# Patient Record
Sex: Male | Born: 1985 | Race: Black or African American | Hispanic: No | Marital: Married | Smoking: Never smoker
Health system: Southern US, Community
[De-identification: ages and names within clinical notes are randomized; demographics above are authoritative.]

## PROBLEM LIST (undated history)

## (undated) DIAGNOSIS — I10 Essential (primary) hypertension: Secondary | ICD-10-CM

## (undated) DIAGNOSIS — G8929 Other chronic pain: Secondary | ICD-10-CM

## (undated) DIAGNOSIS — J302 Other seasonal allergic rhinitis: Secondary | ICD-10-CM

## (undated) DIAGNOSIS — M5430 Sciatica, unspecified side: Secondary | ICD-10-CM

## (undated) DIAGNOSIS — N289 Disorder of kidney and ureter, unspecified: Secondary | ICD-10-CM

## (undated) DIAGNOSIS — M109 Gout, unspecified: Secondary | ICD-10-CM

## (undated) DIAGNOSIS — R51 Headache: Secondary | ICD-10-CM

## (undated) DIAGNOSIS — M549 Dorsalgia, unspecified: Secondary | ICD-10-CM

## (undated) HISTORY — DX: Other chronic pain: G89.29

## (undated) HISTORY — DX: Other seasonal allergic rhinitis: J30.2

## (undated) HISTORY — DX: Essential (primary) hypertension: I10

## (undated) HISTORY — DX: Dorsalgia, unspecified: M54.9

## (undated) HISTORY — DX: Headache: R51

---

## 2003-02-11 ENCOUNTER — Emergency Department (HOSPITAL_COMMUNITY): Admission: EM | Admit: 2003-02-11 | Discharge: 2003-02-11 | Payer: Self-pay | Admitting: Emergency Medicine

## 2003-02-11 ENCOUNTER — Encounter: Payer: Self-pay | Admitting: Emergency Medicine

## 2003-09-23 DIAGNOSIS — G8929 Other chronic pain: Secondary | ICD-10-CM

## 2003-09-23 HISTORY — DX: Other chronic pain: G89.29

## 2008-08-18 ENCOUNTER — Emergency Department (HOSPITAL_COMMUNITY): Admission: EM | Admit: 2008-08-18 | Discharge: 2008-08-18 | Payer: Self-pay | Admitting: *Deleted

## 2010-04-11 ENCOUNTER — Ambulatory Visit: Payer: Self-pay | Admitting: Family Medicine

## 2010-06-27 ENCOUNTER — Ambulatory Visit: Payer: Self-pay | Admitting: Family Medicine

## 2010-07-29 ENCOUNTER — Ambulatory Visit: Payer: Self-pay | Admitting: Family Medicine

## 2010-09-24 ENCOUNTER — Ambulatory Visit
Admission: RE | Admit: 2010-09-24 | Discharge: 2010-09-24 | Payer: Self-pay | Source: Home / Self Care | Attending: Family Medicine | Admitting: Family Medicine

## 2011-06-24 LAB — POCT I-STAT, CHEM 8
Calcium, Ion: 1.18
Chloride: 104
Glucose, Bld: 103 — ABNORMAL HIGH
HCT: 50
Hemoglobin: 17
Potassium: 3.7

## 2011-08-30 ENCOUNTER — Emergency Department (HOSPITAL_COMMUNITY)
Admission: EM | Admit: 2011-08-30 | Discharge: 2011-08-31 | Disposition: A | Discharge status: Home / Self Care | Payer: 59 | Attending: Emergency Medicine | Admitting: Emergency Medicine

## 2011-08-30 ENCOUNTER — Encounter: Payer: Self-pay | Admitting: *Deleted

## 2011-08-30 DIAGNOSIS — M549 Dorsalgia, unspecified: Secondary | ICD-10-CM | POA: Insufficient documentation

## 2011-08-30 DIAGNOSIS — R10816 Epigastric abdominal tenderness: Secondary | ICD-10-CM | POA: Insufficient documentation

## 2011-08-30 DIAGNOSIS — K2970 Gastritis, unspecified, without bleeding: Secondary | ICD-10-CM

## 2011-08-30 DIAGNOSIS — K2990 Gastroduodenitis, unspecified, without bleeding: Secondary | ICD-10-CM | POA: Insufficient documentation

## 2011-08-30 DIAGNOSIS — Z79899 Other long term (current) drug therapy: Secondary | ICD-10-CM | POA: Insufficient documentation

## 2011-08-30 DIAGNOSIS — R109 Unspecified abdominal pain: Secondary | ICD-10-CM | POA: Insufficient documentation

## 2011-08-30 MED ORDER — GI COCKTAIL ~~LOC~~
ORAL | Status: AC
  Administered 2011-08-30: 30 mL via ORAL
  Filled 2011-08-30: qty 30

## 2011-08-30 MED ORDER — GI COCKTAIL ~~LOC~~
30.0000 mL | Freq: Once | ORAL | Status: AC
  Administered 2011-08-30: 30 mL via ORAL

## 2011-08-30 NOTE — ED Notes (Signed)
Sudden onset of acute abdominal pain around 4:30pm when he woke up.  Patient is denies any n/v/d

## 2011-08-30 NOTE — ED Notes (Signed)
I gave the patients visitor a cup of ice and a coke. 

## 2011-08-30 NOTE — ED Notes (Signed)
Pt resting quietly at the time. States 6/10 upper quadrant abdominal pain. No active vomiting. Family at bedside. Will continue to monitor.

## 2011-08-31 MED ORDER — PANTOPRAZOLE SODIUM 20 MG PO TBEC: 20.0000 mg | DELAYED_RELEASE_TABLET | Freq: Once | ORAL | Status: DC

## 2011-08-31 MED ORDER — PANTOPRAZOLE SODIUM 20 MG PO TBEC
20.0000 mg | DELAYED_RELEASE_TABLET | Freq: Once | ORAL | Status: AC
  Administered 2011-08-31: 20 mg via ORAL
  Filled 2011-08-31: qty 1

## 2011-08-31 NOTE — ED Provider Notes (Signed)
Medical screening examination/treatment/procedure(s) were performed by non-physician practitioner and as supervising physician I was immediately available for consultation/collaboration.   Duaine Radin L Reesa Gotschall, MD 08/31/11 0746 

## 2011-08-31 NOTE — ED Notes (Signed)
Patient given discharge paperwork; went over discharge instructions with patient.  Instructed patient to take Protonix as directed, to follow up with primary care physician, and to return to the ED for new/worsening/concerning symptoms.

## 2011-08-31 NOTE — ED Provider Notes (Signed)
History     CSN: [id number] Arrival date & time: 08/30/2011  8:27 PM   First MD Initiated Contact with Patient 08/30/11 2332      Chief Complaint  Patient presents with  . Abdominal Pain    (Consider location/radiation/quality/duration/timing/severity/associated sxs/prior treatment) HPI Comments: Patient here with acute onset of epigastric abdominal pain starting at 4pm today - he did not take any thing for this - states that has had similar in the past - reports used tums to help in the past - did not take this taday  Patient is a 25 y.o. male presenting with abdominal pain. The history is provided by the patient. No language interpreter was used.  Abdominal Pain The primary symptoms of the illness include abdominal pain. The primary symptoms of the illness do not include fever, fatigue, nausea, vomiting, diarrhea, hematemesis, hematochezia or dysuria. The current episode started 3 to 5 hours ago. The onset of the illness was sudden. The problem has not changed since onset. The illness is associated with NSAID use. The patient states that she believes she is currently not pregnant. The patient has not had a change in bowel habit. Additional symptoms associated with the illness include back pain. Symptoms associated with the illness do not include chills, anorexia, diaphoresis, heartburn, constipation, urgency or hematuria. Significant associated medical issues include GERD.    History reviewed. No pertinent past medical history.  History reviewed. No pertinent past surgical history.  History reviewed. No pertinent family history.  History  Substance Use Topics  . Smoking status: Never Smoker   . Smokeless tobacco: Not on file  . Alcohol Use: No      Review of Systems  Constitutional: Negative for fever, chills, diaphoresis and fatigue.  Gastrointestinal: Positive for abdominal pain. Negative for heartburn, nausea, vomiting, diarrhea, constipation, hematochezia, anorexia and  hematemesis.  Genitourinary: Negative for dysuria, urgency and hematuria.  Musculoskeletal: Positive for back pain.  All other systems reviewed and are negative.    Allergies  Review of patient's allergies indicates no known allergies.  Home Medications   Current Outpatient Rx  Name Route Sig Dispense Refill  . LISINOPRIL-HYDROCHLOROTHIAZIDE 10-12.5 MG PO TABS Oral Take 1 tablet by mouth daily.        BP 114/76  Pulse 64  Temp(Src) 97.8 F (36.6 C) (Oral)  Resp 18  Ht 6\' 3"  (1.905 m)  Wt 235 lb (106.595 kg)  BMI 29.37 kg/m2  SpO2 98%  Physical Exam  Nursing note and vitals reviewed. Constitutional: He is oriented to person, place, and time. He appears well-developed and well-nourished. No distress.  HENT:  Head: Normocephalic and atraumatic.  Right Ear: External ear normal.  Left Ear: External ear normal.  Mouth/Throat: No oropharyngeal exudate.  Eyes: Conjunctivae are normal. Pupils are equal, round, and reactive to light. No scleral icterus.  Neck: Normal range of motion. Neck supple.  Cardiovascular: Normal rate, regular rhythm and normal heart sounds.   Pulmonary/Chest: Effort normal and breath sounds normal. No respiratory distress.  Abdominal: Soft. Bowel sounds are normal. He exhibits no distension. There is tenderness. There is no rebound and no guarding.       Epigastric ttp - negative Murphy's sign  Musculoskeletal: Normal range of motion.  Lymphadenopathy:    He has no cervical adenopathy.  Neurological: He is alert and oriented to person, place, and time.  Skin: Skin is warm and dry.  Psychiatric: He has a normal mood and affect. His behavior is normal. Judgment and thought content normal.  ED Course  Procedures (including critical care time)  Labs Reviewed - No data to display No results found.   Gastritis   MDM  Improvement in symptoms after protonix and GI cocktail - no ttp to RUQ so doubt cholelithiasis or cholecystitis.           Izola Price [place], [place] 08/31/11 [phone]

## 2011-08-31 NOTE — ED Notes (Signed)
I gave the patients visitor a warm pack for her back.

## 2011-12-01 ENCOUNTER — Other Ambulatory Visit: Payer: Self-pay | Admitting: Family Medicine

## 2012-12-31 ENCOUNTER — Other Ambulatory Visit: Payer: Self-pay | Admitting: Family Medicine

## 2013-01-01 ENCOUNTER — Other Ambulatory Visit: Payer: Self-pay | Admitting: Family Medicine

## 2013-01-10 ENCOUNTER — Other Ambulatory Visit: Payer: Self-pay | Admitting: Family Medicine

## 2013-01-10 ENCOUNTER — Encounter: Payer: Self-pay | Admitting: Family Medicine

## 2013-01-10 ENCOUNTER — Ambulatory Visit (INDEPENDENT_AMBULATORY_CARE_PROVIDER_SITE_OTHER): Payer: PRIVATE HEALTH INSURANCE | Admitting: Family Medicine

## 2013-01-10 VITALS — BP 140/90 | HR 88 | Wt 242.0 lb

## 2013-01-10 DIAGNOSIS — I10 Essential (primary) hypertension: Secondary | ICD-10-CM

## 2013-01-10 DIAGNOSIS — Z79899 Other long term (current) drug therapy: Secondary | ICD-10-CM

## 2013-01-10 LAB — CBC WITH DIFFERENTIAL/PLATELET
Basophils Relative: 1 % (ref 0–1)
Eosinophils Absolute: 0.2 10*3/uL (ref 0.0–0.7)
Eosinophils Relative: 3 % (ref 0–5)
HCT: 39.6 % (ref 39.0–52.0)
Hemoglobin: 13.7 g/dL (ref 13.0–17.0)
Lymphs Abs: 2.6 10*3/uL (ref 0.7–4.0)
MCH: 27.5 pg (ref 26.0–34.0)
MCHC: 34.6 g/dL (ref 30.0–36.0)
MCV: 79.4 fL (ref 78.0–100.0)
Monocytes Absolute: 0.4 10*3/uL (ref 0.1–1.0)
Monocytes Relative: 6 % (ref 3–12)
Neutrophils Relative %: 49 % (ref 43–77)

## 2013-01-10 LAB — COMPREHENSIVE METABOLIC PANEL
Alkaline Phosphatase: 71 U/L (ref 39–117)
BUN: 13 mg/dL (ref 6–23)
CO2: 26 mEq/L (ref 19–32)
Glucose, Bld: 110 mg/dL — ABNORMAL HIGH (ref 70–99)
Total Bilirubin: 0.7 mg/dL (ref 0.3–1.2)

## 2013-01-10 LAB — LIPID PANEL
Cholesterol: 186 mg/dL (ref 0–200)
Triglycerides: 259 mg/dL — ABNORMAL HIGH (ref <150)
VLDL: 52 mg/dL — ABNORMAL HIGH (ref 0–40)

## 2013-01-10 MED ORDER — LISINOPRIL-HYDROCHLOROTHIAZIDE 10-12.5 MG PO TABS: ORAL_TABLET | ORAL | Status: DC

## 2013-01-10 NOTE — Progress Notes (Signed)
  Subjective:    Patient ID: Harold Young, male    DOB: 1986-06-08, 27 y.o.   MRN: [id number]  HPI He is here for recheck on his blood pressure. He has been out of medicine for several weeks. He was not seen last year due to financial difficulties. He now apparently has better insurance coverage. His work keeps him quite physically active. He and his wife have made some dietary changes recently which he hopes will help with his blood pressure is well. They have both cut back on carbonated beverages.He has not had a complete exam done quite some time.   Review of Systems     Objective:   Physical Exam Alert and in no distress. Blood pressure is recorded.       Assessment & Plan:  Hypertension - Plan: Lipid panel, CBC with Differential, Comprehensive metabolic panel, lisinopril-hydrochlorothiazide (PRINZIDE,ZESTORETIC) 10-12.5 MG per tablet  Encounter for long-term (current) use of other medications - Plan: Lipid panel, CBC with Differential, Comprehensive metabolic panel I will do his medication. Discussed physical activity as well as dietary modification. Since he is busy with work, I  Recommended that he concentrate more on his eating habits especially cutting back on carbohydrates. He is to schedule complete exam to

## 2013-01-10 NOTE — Patient Instructions (Signed)
Look at potentially cutting back on carbohydrates. WHITE FOOD

## 2013-01-11 LAB — HEMOGLOBIN A1C
Hgb A1c MFr Bld: 6.1 % — ABNORMAL HIGH (ref <5.7)
Mean Plasma Glucose: 128 mg/dL — ABNORMAL HIGH (ref <117)

## 2013-01-11 NOTE — Progress Notes (Signed)
Quick Note:  CALLED PT TO INFORMED WORD FOR WORD Let him know that his triglycerides are high. Let him know also that he is at risk for diabetes. Send dietary information. ALSO MAILED COPY OF LABS AND DIET INFO FOR CHOLESTEROL AND DIABETES  ______

## 2013-02-23 ENCOUNTER — Encounter: Payer: Self-pay | Admitting: Internal Medicine

## 2013-03-08 ENCOUNTER — Encounter: Payer: Self-pay | Admitting: Medical

## 2013-03-08 ENCOUNTER — Ambulatory Visit (INDEPENDENT_AMBULATORY_CARE_PROVIDER_SITE_OTHER): Payer: Self-pay | Admitting: Medical

## 2013-03-08 VITALS — BP 112/82 | HR 76 | Temp 98.0°F | Resp 16 | Ht 73.2 in | Wt 231.0 lb

## 2013-03-08 DIAGNOSIS — R7989 Other specified abnormal findings of blood chemistry: Secondary | ICD-10-CM

## 2013-03-08 DIAGNOSIS — R7301 Impaired fasting glucose: Secondary | ICD-10-CM

## 2013-03-08 DIAGNOSIS — I10 Essential (primary) hypertension: Secondary | ICD-10-CM

## 2013-03-08 DIAGNOSIS — E781 Pure hyperglyceridemia: Secondary | ICD-10-CM

## 2013-03-08 DIAGNOSIS — Z0000 Encounter for general adult medical examination without abnormal findings: Secondary | ICD-10-CM

## 2013-03-08 LAB — BASIC METABOLIC PANEL
CO2: 24 mEq/L (ref 19–32)
Calcium: 10 mg/dL (ref 8.4–10.5)
Chloride: 102 mEq/L (ref 96–112)
Glucose, Bld: 93 mg/dL (ref 70–99)
Sodium: 139 mEq/L (ref 135–145)

## 2013-03-08 LAB — LIPID PANEL
HDL: 36 mg/dL — ABNORMAL LOW (ref >39)
LDL Cholesterol: 133 mg/dL — ABNORMAL HIGH (ref 0–99)
Triglycerides: 124 mg/dL (ref <150)

## 2013-03-08 LAB — POCT URINALYSIS DIPSTICK
Bilirubin, UA: NEGATIVE
Blood, UA: NEGATIVE
Glucose, UA: NEGATIVE
Leukocytes, UA: NEGATIVE
Nitrite, UA: NEGATIVE
Urobilinogen, UA: NEGATIVE
pH, UA: 5

## 2013-03-08 NOTE — Progress Notes (Signed)
Subjective:   HPI  Harold Young is a 27 y.o. male who presents for a complete physical.  Saw Dr. Susann Givens recently for recheck on hypertension.  He is also here today to discuss recent abnormal blood sugar and cholesterol labs.      Preventative care: Last ophthalmology visit:N/A Last dental visit:N/A Last colonoscopy:N/A Last prostate exam: N/A Last EKG:2006- [employer] Last labs:12/2012  Prior vaccinations: TD or Tdap:2006 -[employer] Influenza:N/A Pneumococcal:N/A Shingles/ZostavaxN/A:  Advanced directive:N/A Health care power of attorney:N/A Living will:N/A  Concerns: compliant with BP medication.  Diagnosed with hypertension 2-3 years ago.  Since last visit started making significant diet changes, eating baked foods instead of fried, wheat bread instead of white, drinking more water, has lost 11 lb since last visit.    Reviewed their medical, surgical, family, social, medication, and allergy history and updated chart as appropriate.   Past Medical History  Diagnosis Date  . Hypertension   . Seasonal allergic rhinitis   . Chronic headaches   . Chronic back pain 2005    s/p injury at work    History reviewed. No pertinent past surgical history.  Family History  Problem Relation Age of Onset  . Hypertension Mother   . Polycystic ovary syndrome Mother   . Diabetes Father   . Hypertension Father   . Bipolar disorder Sister   . Cancer Neg Hx   . Heart disease Neg Hx   . Stroke Neg Hx     History   Social History  . Marital Status: Legally Separated    Spouse Name: N/A    Number of Children: N/A  . Years of Education: N/A   Occupational History  . Not on file.   Social History Main Topics  . Smoking status: Never Smoker   . Smokeless tobacco: Not on file  . Alcohol Use: No  . Drug Use: No  . Sexually Active: Not on file   Other Topics Concern  . Not on file   Social History Narrative   Exercise 3 days a week with walking, some caffeine intake,  works Occupational psychologist at [employer], has 2 children, has significant other    Current Outpatient Prescriptions on File Prior to Visit  Medication Sig Dispense Refill  . lisinopril-hydrochlorothiazide (PRINZIDE,ZESTORETIC) 10-12.5 MG per tablet TAKE ONE TABLET BY MOUTH EVERY DAY  90 tablet  3   No current facility-administered medications on file prior to visit.    No Known Allergies   Review of Systems Constitutional: -fever, -chills, -sweats, -unexpected weight change, -decreased appetite, -fatigue Allergy: -sneezing, -itching, -congestion Dermatology: -changing moles, --rash, -lumps ENT: -runny nose, -ear pain, -sore throat, -hoarseness, +sinus pain, -teeth pain, - ringing in ears, -hearing loss, -nosebleeds Cardiology: -chest pain, -palpitations, -swelling, -difficulty breathing when lying flat, -waking up short of breath Respiratory: -cough, -shortness of breath, -difficulty breathing with exercise or exertion, -wheezing, -coughing up blood Gastroenterology:+-abdominal pain, -nausea, -vomiting, -diarrhea, -constipation, -blood in stool, -changes in bowel movement, -difficulty swallowing or eating Hematology: -bleeding, -bruising  Musculoskeletal: -joint aches, -muscle aches, -joint swelling, -+Back pain, -neck pain, -cramping, -changes in gait Ophthalmology: denies vision changes, eye redness, itching, discharge Urology: -burning with urination, -difficulty urinating, -blood in urine, -urinary frequency, -urgency, -incontinence Neurology: -+headache, -weakness, -tingling, -numbness, -memory loss, -falls, -dizziness Psychology: -depressed mood, -agitation, -sleep problems     Objective:   Physical Exam  Nurse notes and vital signs reviewed  General appearance: alert, no distress, WD/WN, AA male Skin:tattoos bilat upper arms, no worrisome lesions HEENT: normocephalic,  conjunctiva/corneas normal, sclerae anicteric, PERRLA, EOMi, nares patent, no discharge or erythema, pharynx  normal Oral cavity: MMM, tongue normal, teeth in good repair Neck: supple, no lymphadenopathy, no thyromegaly, no masses, normal ROM, no bruits Chest: non tender, normal shape and expansion Heart: RRR, normal S1, S2, no murmurs Lungs: CTA bilaterally, no wheezes, rhonchi, or rales Abdomen: +bs, soft, surgical scar inferior to umbilicus, non tender, non distended, no masses, no hepatomegaly, no splenomegaly, no bruits Back: non tender, normal ROM, no scoliosis Musculoskeletal: upper extremities non tender, no obvious deformity, normal ROM throughout, lower extremities non tender, no obvious deformity, normal ROM throughout Extremities: no edema, no cyanosis, no clubbing Pulses: 2+ symmetric, upper and lower extremities, normal cap refill Neurological: alert, oriented x 3, CN2-12 intact, strength normal upper extremities and lower extremities, sensation normal throughout, DTRs 2+ throughout, no cerebellar signs, gait normal Psychiatric: normal affect, behavior normal, pleasant  GU: normal male external genitalia, circumcised, nontender, no masses, no hernia, no lymphadenopathy Rectal: deferred   Assessment and Plan :      Encounter Diagnoses  Name Primary?  . Routine general medical examination at a health care facility Yes  . Other abnormal blood chemistry   . Essential hypertension, benign   . Impaired fasting blood sugar   . Hypertriglyceridemia     Physical exam - discussed healthy lifestyle, diet, exercise, preventative care, vaccinations, and addressed their concerns.  See dentist soon for routine care.  Recent elevated creatinine.   If still elevated, consider medication change, consider abdomen imaging for renal artery stenosis.    HTN - controlled on current medication.  Impaired fasting glucose/hypertriglyceridemia - glad to hear he has made diet changes and exercising more.  C/t present diet and exercise.  Follow-up pending labs today.

## 2013-03-08 NOTE — Progress Notes (Signed)
RIGHT ARM BP-120/90 LEFT ARM BP-130/90

## 2013-03-09 ENCOUNTER — Other Ambulatory Visit: Payer: Self-pay | Admitting: Medical

## 2013-03-09 DIAGNOSIS — R809 Proteinuria, unspecified: Secondary | ICD-10-CM

## 2013-03-09 DIAGNOSIS — I10 Essential (primary) hypertension: Secondary | ICD-10-CM

## 2013-03-09 DIAGNOSIS — R7989 Other specified abnormal findings of blood chemistry: Secondary | ICD-10-CM

## 2013-03-09 MED ORDER — HYDROCHLOROTHIAZIDE 25 MG PO TABS: 25.0000 mg | ORAL_TABLET | Freq: Every day | ORAL | Status: DC

## 2013-03-17 ENCOUNTER — Ambulatory Visit
Admission: RE | Admit: 2013-03-17 | Discharge: 2013-03-17 | Disposition: A | Discharge status: Home / Self Care | Payer: PRIVATE HEALTH INSURANCE | Source: Ambulatory Visit | Attending: Medical | Admitting: Medical

## 2013-03-17 ENCOUNTER — Ambulatory Visit
Admission: RE | Admit: 2013-03-17 | Discharge: 2013-03-17 | Disposition: A | Discharge status: Home / Self Care | Payer: 59 | Source: Ambulatory Visit | Attending: Medical | Admitting: Medical

## 2013-03-17 DIAGNOSIS — I10 Essential (primary) hypertension: Secondary | ICD-10-CM

## 2013-03-17 DIAGNOSIS — R809 Proteinuria, unspecified: Secondary | ICD-10-CM

## 2013-03-17 DIAGNOSIS — R7989 Other specified abnormal findings of blood chemistry: Secondary | ICD-10-CM

## 2013-03-21 ENCOUNTER — Telehealth: Payer: Self-pay | Admitting: Medical

## 2013-03-21 NOTE — Telephone Encounter (Signed)
Pt called and requested a copy of his labs be sent to him. Done

## 2013-06-08 ENCOUNTER — Telehealth: Payer: Self-pay | Admitting: Medical

## 2013-06-08 MED ORDER — HYDROCHLOROTHIAZIDE 25 MG PO TABS: 25.0000 mg | ORAL_TABLET | Freq: Every day | ORAL | Status: DC

## 2013-06-08 NOTE — Telephone Encounter (Signed)
Done

## 2013-06-08 NOTE — Telephone Encounter (Signed)
Pt needs a refill on bp meds sent to [facility]/ pt is completely out.

## 2013-06-09 ENCOUNTER — Encounter: Payer: Self-pay | Admitting: Medical

## 2013-12-04 ENCOUNTER — Emergency Department (HOSPITAL_COMMUNITY)
Admission: EM | Admit: 2013-12-04 | Discharge: 2013-12-04 | Disposition: A | Discharge status: Home / Self Care | Payer: No Typology Code available for payment source | Attending: Emergency Medicine | Admitting: Emergency Medicine

## 2013-12-04 ENCOUNTER — Encounter (HOSPITAL_COMMUNITY): Payer: Self-pay | Admitting: Emergency Medicine

## 2013-12-04 DIAGNOSIS — I10 Essential (primary) hypertension: Secondary | ICD-10-CM | POA: Insufficient documentation

## 2013-12-04 DIAGNOSIS — R51 Headache: Secondary | ICD-10-CM

## 2013-12-04 DIAGNOSIS — B9789 Other viral agents as the cause of diseases classified elsewhere: Secondary | ICD-10-CM | POA: Insufficient documentation

## 2013-12-04 DIAGNOSIS — Z79899 Other long term (current) drug therapy: Secondary | ICD-10-CM | POA: Insufficient documentation

## 2013-12-04 DIAGNOSIS — Z87828 Personal history of other (healed) physical injury and trauma: Secondary | ICD-10-CM | POA: Insufficient documentation

## 2013-12-04 DIAGNOSIS — B349 Viral infection, unspecified: Secondary | ICD-10-CM

## 2013-12-04 DIAGNOSIS — G8929 Other chronic pain: Secondary | ICD-10-CM | POA: Insufficient documentation

## 2013-12-04 DIAGNOSIS — R519 Headache, unspecified: Secondary | ICD-10-CM

## 2013-12-04 MED ORDER — METOCLOPRAMIDE HCL 5 MG/ML IJ SOLN
10.0000 mg | Freq: Once | INTRAMUSCULAR | Status: AC
  Administered 2013-12-04: 10 mg via INTRAVENOUS
  Filled 2013-12-04: qty 2

## 2013-12-04 MED ORDER — IBUPROFEN 600 MG PO TABS: 600.0000 mg | ORAL_TABLET | Freq: Four times a day (QID) | ORAL | Status: DC | PRN

## 2013-12-04 MED ORDER — DIPHENHYDRAMINE HCL 50 MG/ML IJ SOLN
25.0000 mg | Freq: Once | INTRAMUSCULAR | Status: AC
  Administered 2013-12-04: 25 mg via INTRAVENOUS
  Filled 2013-12-04: qty 1

## 2013-12-04 MED ORDER — SODIUM CHLORIDE 0.9 % IV BOLUS (SEPSIS)
1000.0000 mL | Freq: Once | INTRAVENOUS | Status: AC
  Administered 2013-12-04: 1000 mL via INTRAVENOUS

## 2013-12-04 MED ORDER — KETOROLAC TROMETHAMINE 30 MG/ML IJ SOLN
30.0000 mg | Freq: Once | INTRAMUSCULAR | Status: AC
  Administered 2013-12-04: 30 mg via INTRAVENOUS
  Filled 2013-12-04: qty 1

## 2013-12-04 NOTE — ED Provider Notes (Signed)
CSN: [id number]     Arrival date & time 12/04/13  0046 History   First MD Initiated Contact with Patient 12/04/13 0325     Chief Complaint  Patient presents with  . Headache  . Generalized Body Aches  . Chills     (Consider location/radiation/quality/duration/timing/severity/associated sxs/prior Treatment) HPI Comments: Along with the headaches, patient has diffuse body aches, and URI like sx.  Patient is a 28 y.o. male presenting with headaches. The history is provided by the patient.  Headache Pain location:  Frontal Quality:  Stabbing Radiates to:  Does not radiate Severity currently:  7/10 Severity at highest:  7/10 Onset quality:  Gradual Duration:  1 day Timing:  Constant Progression:  Waxing and waning Chronicity:  New Similar to prior headaches: no   Relieved by:  None tried Worsened by:  Light Associated symptoms: congestion and myalgias     Past Medical History  Diagnosis Date  . Hypertension   . Seasonal allergic rhinitis   . Chronic headaches   . Chronic back pain 2005    s/p injury at work   History reviewed. No pertinent past surgical history. Family History  Problem Relation Age of Onset  . Hypertension Mother   . Polycystic ovary syndrome Mother   . Diabetes Father   . Hypertension Father   . Bipolar disorder Sister   . Cancer Neg Hx   . Heart disease Neg Hx   . Stroke Neg Hx    History  Substance Use Topics  . Smoking status: Never Smoker   . Smokeless tobacco: Not on file  . Alcohol Use: No    Review of Systems  HENT: Positive for congestion.   Musculoskeletal: Positive for myalgias.  Skin: Negative for rash.  Neurological: Positive for headaches.  All other systems reviewed and are negative.      Allergies  Review of patient's allergies indicates no known allergies.  Home Medications   Current Outpatient Rx  Name  Route  Sig  Dispense  Refill  . Multiple Vitamin (MULTIVITAMIN WITH MINERALS) TABS tablet   Oral   Take 1  tablet by mouth daily.         Marland Kitchen. VALSARTAN-HYDROCHLOROTHIAZIDE PO   Oral   Take 1 tablet by mouth daily.         Marland Kitchen. ibuprofen (ADVIL,MOTRIN) 600 MG tablet   Oral   Take 1 tablet (600 mg total) by mouth every 6 (six) hours as needed.   30 tablet   0    BP 120/70  Pulse 81  Temp(Src) 99.8 F (37.7 C) (Oral)  Resp 16  Ht 6\' 2"  (1.88 m)  Wt 241 lb (109.317 kg)  BMI 30.93 kg/m2  SpO2 100% Physical Exam  Nursing note and vitals reviewed. Constitutional: He is oriented to person, place, and time. He appears well-developed.  HENT:  Head: Normocephalic and atraumatic.  Eyes: Conjunctivae and EOM are normal. Pupils are equal, round, and reactive to light.  Neck: Normal range of motion. Neck supple.  Cardiovascular: Normal rate and regular rhythm.   Pulmonary/Chest: Effort normal and breath sounds normal.  Abdominal: Soft. Bowel sounds are normal. He exhibits no distension. There is no tenderness. There is no rebound and no guarding.  Neurological: He is alert and oriented to person, place, and time. No cranial nerve deficit. Coordination normal.  Skin: Skin is warm.    ED Course  Procedures (including critical care time) Labs Review Labs Reviewed - No data to display Imaging Review No results  found.   EKG Interpretation None      MDM   Final diagnoses:  Headache  Viral syndrome    Pt comes in with uri like sx and headaches.  Headaches are moderate, not as bad as his migraines - and responded to iv hydration and meds. Pt also has flu like sx with the body aches. Vitals are stable and he is a healthy male.  Safe for discharge.  Derwood Kaplan, MD 12/04/13 [phone]

## 2013-12-04 NOTE — Discharge Instructions (Signed)
We saw you in the ER for headaches. All the labs and imaging are normal. We are not sure what is causing your headaches, however, there appears to be no evidence of infection, bleeds or tumors based on our exam and results.  Please take motrin round the clock for the next 6 hours, and take other meds prescribed only for break through pain. See your doctor if the pain persists, as you might need better medications or a specialist.   Viral Infections A viral infection can be caused by different types of viruses.Most viral infections are not serious and resolve on their own. However, some infections may cause severe symptoms and may lead to further complications. SYMPTOMS Viruses can frequently cause:  Minor sore throat.  Aches and pains.  Headaches.  Runny nose.  Different types of rashes.  Watery eyes.  Tiredness.  Cough.  Loss of appetite.  Gastrointestinal infections, resulting in nausea, vomiting, and diarrhea. These symptoms do not respond to antibiotics because the infection is not caused by bacteria. However, you might catch a bacterial infection following the viral infection. This is sometimes called a "superinfection." Symptoms of such a bacterial infection may include:  Worsening sore throat with pus and difficulty swallowing.  Swollen neck glands.  Chills and a high or persistent fever.  Severe headache.  Tenderness over the sinuses.  Persistent overall ill feeling (malaise), muscle aches, and tiredness (fatigue).  Persistent cough.  Yellow, green, or brown mucus production with coughing. HOME CARE INSTRUCTIONS   Only take over-the-counter or prescription medicines for pain, discomfort, diarrhea, or fever as directed by your caregiver.  Drink enough water and fluids to keep your urine clear or pale yellow. Sports drinks can provide valuable electrolytes, sugars, and hydration.  Get plenty of rest and maintain proper nutrition. Soups and broths with  crackers or rice are fine. SEEK IMMEDIATE MEDICAL CARE IF:   You have severe headaches, shortness of breath, chest pain, neck pain, or an unusual rash.  You have uncontrolled vomiting, diarrhea, or you are unable to keep down fluids.  You or your child has an oral temperature above 102 F (38.9 C), not controlled by medicine.  Your baby is older than 3 months with a rectal temperature of 102 F (38.9 C) or higher.  Your baby is 533 months old or younger with a rectal temperature of 100.4 F (38 C) or higher. MAKE SURE YOU:   Understand these instructions.  Will watch your condition.  Will get help right away if you are not doing well or get worse. Document Released: 06/18/2005 Document Revised: 12/01/2011 Document Reviewed: 01/13/2011 [facility] Patient Information 2014 [place], [place].  Headaches, Frequently Asked Questions MIGRAINE HEADACHES Q: What is migraine? What causes it? How can I treat it? A: Generally, migraine headaches begin as a dull ache. Then they develop into a constant, throbbing, and pulsating pain. You may experience pain at the temples. You may experience pain at the front or back of one or both sides of the head. The pain is usually accompanied by a combination of:  Nausea.  Vomiting.  Sensitivity to light and noise. Some people (about 15%) experience an aura (see below) before an attack. The cause of migraine is believed to be chemical reactions in the brain. Treatment for migraine may include over-the-counter or prescription medications. It may also include self-help techniques. These include relaxation training and biofeedback.  Q: What is an aura? A: About 15% of people with migraine get an "aura". This is a sign  of neurological symptoms that occur before a migraine headache. You may see wavy or jagged lines, dots, or flashing lights. You might experience tunnel vision or blind spots in one or both eyes. The aura can include visual or auditory hallucinations  (something imagined). It may include disruptions in smell (such as strange odors), taste or touch. Other symptoms include:  Numbness.  A "pins and needles" sensation.  Difficulty in recalling or speaking the correct word. These neurological events may last as long as 60 minutes. These symptoms will fade as the headache begins. Q: What is a trigger? A: Certain physical or environmental factors can lead to or "trigger" a migraine. These include:  Foods.  Hormonal changes.  Weather.  Stress. It is important to remember that triggers are different for everyone. To help prevent migraine attacks, you need to figure out which triggers affect you. Keep a headache diary. This is a good way to track triggers. The diary will help you talk to your healthcare professional about your condition. Q: Does weather affect migraines? A: Bright sunshine, hot, humid conditions, and drastic changes in barometric pressure may lead to, or "trigger," a migraine attack in some people. But studies have shown that weather does not act as a trigger for everyone with migraines. Q: What is the link between migraine and hormones? A: Hormones start and regulate many of your body's functions. Hormones keep your body in balance within a constantly changing environment. The levels of hormones in your body are unbalanced at times. Examples are during menstruation, pregnancy, or menopause. That can lead to a migraine attack. In fact, about three quarters of all women with migraine report that their attacks are related to the menstrual cycle.  Q: Is there an increased risk of stroke for migraine sufferers? A: The likelihood of a migraine attack causing a stroke is very remote. That is not to say that migraine sufferers cannot have a stroke associated with their migraines. In persons under age 28, the most common associated factor for stroke is migraine headache. But over the course of a person's normal life span, the occurrence of  migraine headache may actually be associated with a reduced risk of dying from cerebrovascular disease due to stroke.  Q: What are acute medications for migraine? A: Acute medications are used to treat the pain of the headache after it has started. Examples over-the-counter medications, NSAIDs, ergots, and triptans.  Q: What are the triptans? A: Triptans are the newest class of abortive medications. They are specifically targeted to treat migraine. Triptans are vasoconstrictors. They moderate some chemical reactions in the brain. The triptans work on receptors in your brain. Triptans help to restore the balance of a neurotransmitter called serotonin. Fluctuations in levels of serotonin are thought to be a main cause of migraine.  Q: Are over-the-counter medications for migraine effective? A: Over-the-counter, or "OTC," medications may be effective in relieving mild to moderate pain and associated symptoms of migraine. But you should see your caregiver before beginning any treatment regimen for migraine.  Q: What are preventive medications for migraine? A: Preventive medications for migraine are sometimes referred to as "prophylactic" treatments. They are used to reduce the frequency, severity, and length of migraine attacks. Examples of preventive medications include antiepileptic medications, antidepressants, beta-blockers, calcium channel blockers, and NSAIDs (nonsteroidal anti-inflammatory drugs). Q: Why are anticonvulsants used to treat migraine? A: During the past few years, there has been an increased interest in antiepileptic drugs for the prevention of migraine. They are sometimes referred  to as "anticonvulsants". Both epilepsy and migraine may be caused by similar reactions in the brain.  Q: Why are antidepressants used to treat migraine? A: Antidepressants are typically used to treat people with depression. They may reduce migraine frequency by regulating chemical levels, such as serotonin, in  the brain.  Q: What alternative therapies are used to treat migraine? A: The term "alternative therapies" is often used to describe treatments considered outside the scope of conventional Western medicine. Examples of alternative therapy include acupuncture, acupressure, and yoga. Another common alternative treatment is herbal therapy. Some herbs are believed to relieve headache pain. Always discuss alternative therapies with your caregiver before proceeding. Some herbal products contain arsenic and other toxins. TENSION HEADACHES Q: What is a tension-type headache? What causes it? How can I treat it? A: Tension-type headaches occur randomly. They are often the result of temporary stress, anxiety, fatigue, or anger. Symptoms include soreness in your temples, a tightening band-like sensation around your head (a "vice-like" ache). Symptoms can also include a pulling feeling, pressure sensations, and contracting head and neck muscles. The headache begins in your forehead, temples, or the back of your head and neck. Treatment for tension-type headache may include over-the-counter or prescription medications. Treatment may also include self-help techniques such as relaxation training and biofeedback. CLUSTER HEADACHES Q: What is a cluster headache? What causes it? How can I treat it? A: Cluster headache gets its name because the attacks come in groups. The pain arrives with little, if any, warning. It is usually on one side of the head. A tearing or bloodshot eye and a runny nose on the same side of the headache may also accompany the pain. Cluster headaches are believed to be caused by chemical reactions in the brain. They have been described as the most severe and intense of any headache type. Treatment for cluster headache includes prescription medication and oxygen. SINUS HEADACHES Q: What is a sinus headache? What causes it? How can I treat it? A: When a cavity in the bones of the face and skull (a sinus)  becomes inflamed, the inflammation will cause localized pain. This condition is usually the result of an allergic reaction, a tumor, or an infection. If your headache is caused by a sinus blockage, such as an infection, you will probably have a fever. An x-ray will confirm a sinus blockage. Your caregiver's treatment might include antibiotics for the infection, as well as antihistamines or decongestants.  REBOUND HEADACHES Q: What is a rebound headache? What causes it? How can I treat it? A: A pattern of taking acute headache medications too often can lead to a condition known as "rebound headache." A pattern of taking too much headache medication includes taking it more than 2 days per week or in excessive amounts. That means more than the label or a caregiver advises. With rebound headaches, your medications not only stop relieving pain, they actually begin to cause headaches. Doctors treat rebound headache by tapering the medication that is being overused. Sometimes your caregiver will gradually substitute a different type of treatment or medication. Stopping may be a challenge. Regularly overusing a medication increases the potential for serious side effects. Consult a caregiver if you regularly use headache medications more than 2 days per week or more than the label advises. ADDITIONAL QUESTIONS AND ANSWERS Q: What is biofeedback? A: Biofeedback is a self-help treatment. Biofeedback uses special equipment to monitor your body's involuntary physical responses. Biofeedback monitors:  Breathing.  Pulse.  Heart rate.  Temperature.  Muscle tension.  Brain activity. Biofeedback helps you refine and perfect your relaxation exercises. You learn to control the physical responses that are related to stress. Once the technique has been mastered, you do not need the equipment any more. Q: Are headaches hereditary? A: Four out of five (80%) of people that suffer report a family history of migraine.  Scientists are not sure if this is genetic or a family predisposition. Despite the uncertainty, a child has a 50% chance of having migraine if one parent suffers. The child has a 75% chance if both parents suffer.  Q: Can children get headaches? A: By the time they reach high school, most young people have experienced some type of headache. Many safe and effective approaches or medications can prevent a headache from occurring or stop it after it has begun.  Q: What type of doctor should I see to diagnose and treat my headache? A: Start with your primary caregiver. Discuss his or her experience and approach to headaches. Discuss methods of classification, diagnosis, and treatment. Your caregiver may decide to recommend you to a headache specialist, depending upon your symptoms or other physical conditions. Having diabetes, allergies, etc., may require a more comprehensive and inclusive approach to your headache. The National Headache Foundation will provide, upon request, a list of [facility] physician members in your state. Document Released: 11/29/2003 Document Revised: 12/01/2011 Document Reviewed: 05/08/2008 [facility] Patient Information 2014 [place], [place].

## 2013-12-04 NOTE — ED Notes (Signed)
The pt has been ill all day  With a headache chills feeling hot with body aches and dizziness.  He has other family members here with the same

## 2013-12-04 NOTE — ED Notes (Signed)
The pt is still waiting to be seen by the doctor.  His headache is no beter

## 2013-12-04 NOTE — ED Notes (Signed)
Pt discharged home with all belongings, alert and oriented x4, 1 new RX prescribed, pt verbalizes understanding of discharge instructions, pt driven home by spouse

## 2013-12-04 NOTE — ED Notes (Signed)
Pt. reports headache , chills and body aches / dizziness onset today .

## 2013-12-09 ENCOUNTER — Ambulatory Visit (INDEPENDENT_AMBULATORY_CARE_PROVIDER_SITE_OTHER): Payer: Self-pay | Admitting: Medical

## 2013-12-09 ENCOUNTER — Encounter: Payer: Self-pay | Admitting: Medical

## 2013-12-09 VITALS — BP 100/80 | HR 80 | Temp 97.9°F | Resp 16 | Wt 236.0 lb

## 2013-12-09 DIAGNOSIS — N183 Chronic kidney disease, stage 3 unspecified: Secondary | ICD-10-CM

## 2013-12-09 DIAGNOSIS — E860 Dehydration: Secondary | ICD-10-CM

## 2013-12-09 DIAGNOSIS — R42 Dizziness and giddiness: Secondary | ICD-10-CM

## 2013-12-09 DIAGNOSIS — Q613 Polycystic kidney, unspecified: Secondary | ICD-10-CM

## 2013-12-09 DIAGNOSIS — I10 Essential (primary) hypertension: Secondary | ICD-10-CM

## 2013-12-09 LAB — POCT URINALYSIS DIPSTICK
Bilirubin, UA: NEGATIVE
Blood, UA: NEGATIVE
Glucose, UA: NEGATIVE
Ketones, UA: NEGATIVE
LEUKOCYTES UA: NEGATIVE
Nitrite, UA: NEGATIVE
SPEC GRAV UA: 1.02
Urobilinogen, UA: NEGATIVE
pH, UA: 5

## 2013-12-09 LAB — GLUCOSE, POCT (MANUAL RESULT ENTRY): POC Glucose: 115 mg/dl — AB (ref 70–99)

## 2013-12-09 MED ORDER — MECLIZINE HCL 25 MG PO TABS: 25.0000 mg | ORAL_TABLET | Freq: Three times a day (TID) | ORAL | Status: DC

## 2013-12-09 MED ORDER — PROMETHAZINE HCL 25 MG RE SUPP: 25.0000 mg | Freq: Four times a day (QID) | RECTAL | Status: DC | PRN

## 2013-12-09 NOTE — Progress Notes (Signed)
   Subjective:   Harold LameMarvin Young is a 28 y.o. male presenting on 12/09/2013 with Dizziness  His wife brought him in as he is too dizzy to drive.   He reports starting to have dizziness x 12 days. He was seen for flulike symptoms in the emergency department 5 days ago. Supportive care was advised. He currently is complaining of dizziness, nausea, feels like he is learning to the left constantly.  Dizzy, room spinning around. Having mental fog, having trouble coming up with words at times. He is trying to stay hydrated, but not feeling like eating, last meal last evening with pizza.   No other medications taken to help with symptoms, he has taken his losartan HCT this morning.  Just saw kidney specialist recently.   He does still has some cough, spitting up phlegm.   No other aggravating or relieving factors.  No other complaint.  Review of Systems ROS as in subjective      Objective:     Filed Vitals:   12/09/13 1031  BP: 100/80  Pulse: 80  Temp: 97.9 F (36.6 C)  Resp: 16    General appearance: alert, no distress, WD/WN, AA male HEENT: normocephalic, sclerae anicteric, TMs pearly, nares patent, no discharge or erythema, pharynx normal Oral cavity: Somewhat dry mucous membranes, no lesions Neck: supple, no lymphadenopathy, no thyromegaly, no masses, no bruits Heart: RRR, normal S1, S2, no murmurs Lungs: CTA bilaterally, no wheezes, rhonchi, or rales Abdomen: +bs, soft, non tender, non distended, no masses, no hepatomegaly, no splenomegaly Pulses: 2+ symmetric, upper and lower extremities, normal cap refill Neuro: Unable to do heel to toe due to dizziness, otherwise neuro unremarkable, nonfocal     Assessment: Encounter Diagnoses  Name Primary?  . Vertigo Yes  . Dehydration   . Essential hypertension, benign   . Chronic kidney disease (CKD), stage III (moderate)   . Polycystic kidney disease      Plan: I reviewed his recent emergency department notes from 5 days ago.   Discussed his symptoms and findings. I [place] [facility] [place] [facility] which are [facility].  Glucose today and 115, urinalysis unremarkable.  Etiology is most likely BPPV or labrynthitis given the recent viral illness.  Begin meclizine 3 times a day, Phenergan suppository as needed, hydrate well with water, don't drive too dizzy. There is nothing obvious today there was suggest a stroke or more serious etiology, but did discuss symptoms that would prompt a call to 911. Note given for work.  Mariana KaufmanMarvin was seen today for dizziness.  Diagnoses and associated orders for this visit:  Vertigo - Glucose (CBG) - Urinalysis Dipstick  Dehydration - Glucose (CBG) - Urinalysis Dipstick  Essential hypertension, benign - Glucose (CBG) - Urinalysis Dipstick  Chronic kidney disease (CKD), stage III (moderate) - Glucose (CBG) - Urinalysis Dipstick  Polycystic kidney disease - Glucose (CBG) - Urinalysis Dipstick  Other Orders - meclizine (ANTIVERT) 25 MG tablet; Take 1 tablet (25 mg total) by mouth 3 (three) times daily. - promethazine (PHENERGAN) 25 MG suppository; Place 1 suppository (25 mg total) rectally every 6 (six) hours as needed for nausea or vomiting.     Return call back Monday.

## 2013-12-09 NOTE — Patient Instructions (Signed)
  Thank you for giving me the opportunity to serve you today.    Your diagnosis today includes: Encounter Diagnoses  Name Primary?  . Vertigo Yes  . Dehydration   . Essential hypertension, benign   . Chronic kidney disease (CKD), stage III (moderate)   . Polycystic kidney disease      Specific recommendations today include:  Begin Meclizine one tablet 3 times a day for vertigo  Hold off on taking your blood pressure medication through the weekend, plan to resume this home Monday  Significantly increase water intake this weekend until you are urinating clear  If you get really nauseous or have vomiting you may use the promethazine suppository every 6 hours as needed  Rest and don't drive if her too dizzy  Follow up: Call back Monday to let us know how you're feeling   I have included other useful information below for your review.  Vertigo Vertigo means you feel like you or your surroundings are moving when they are not. Vertigo can be dangerous if it occurs when you are at work, driving, or performing difficult activities.  CAUSES  Vertigo occurs when there is a conflict of signals sent to your brain from the visual and sensory systems in your body. There are many different causes of vertigo, including:  Infections, especially in the inner ear.  A bad reaction to a drug or misuse of alcohol and medicines.  Withdrawal from drugs or alcohol.  Rapidly changing positions, such as lying down or rolling over in bed.  A migraine headache.  Decreased blood flow to the brain.  Increased pressure in the brain from a head injury, infection, tumor, or bleeding. SYMPTOMS  You may feel as though the world is spinning around or you are falling to the ground. Because your balance is upset, vertigo can cause nausea and vomiting. You may have involuntary eye movements (nystagmus). DIAGNOSIS  Vertigo is usually diagnosed by physical exam. If the cause of your vertigo is unknown, your  caregiver may perform imaging tests, such as an MRI scan (magnetic resonance imaging). TREATMENT  Most cases of vertigo resolve on their own, without treatment. Depending on the cause, your caregiver may prescribe certain medicines. If your vertigo is related to body position issues, your caregiver may recommend movements or procedures to correct the problem. In rare cases, if your vertigo is caused by certain inner ear problems, you may need surgery. HOME CARE INSTRUCTIONS   Follow your caregiver's instructions.  Avoid driving.  Avoid operating heavy machinery.  Avoid performing any tasks that would be dangerous to you or others during a vertigo episode.  Tell your caregiver if you notice that certain medicines seem to be causing your vertigo. Some of the medicines used to treat vertigo episodes can actually make them worse in some people. SEEK IMMEDIATE MEDICAL CARE IF:   Your medicines do not relieve your vertigo or are making it worse.  You develop problems with talking, walking, weakness, or using your arms, hands, or legs.  You develop severe headaches.  Your nausea or vomiting continues or gets worse.  You develop visual changes.  A family member notices behavioral changes.  Your condition gets worse. MAKE SURE YOU:  Understand these instructions.  Will watch your condition.  Will get help right away if you are not doing well or get worse. Document Released: 06/18/2005 Document Revised: 12/01/2011 Document Reviewed: 03/27/2011 [facility] Patient Information 2014 [place], [place].

## 2013-12-12 ENCOUNTER — Telehealth: Payer: Self-pay | Admitting: Medical

## 2013-12-12 NOTE — Telephone Encounter (Signed)
Lets set up for noncontrast head CT, see if we can do tomorrow.

## 2013-12-13 ENCOUNTER — Other Ambulatory Visit: Payer: Self-pay | Admitting: Family Medicine

## 2013-12-13 DIAGNOSIS — R42 Dizziness and giddiness: Secondary | ICD-10-CM

## 2013-12-13 NOTE — Telephone Encounter (Signed)
Patient is aware of his Head CT appointment on 12/14/13 @ 1200 pm. [facility]

## 2013-12-14 ENCOUNTER — Other Ambulatory Visit: Payer: Self-pay | Admitting: Medical

## 2013-12-14 ENCOUNTER — Ambulatory Visit
Admission: RE | Admit: 2013-12-14 | Discharge: 2013-12-14 | Disposition: A | Discharge status: Home / Self Care | Payer: No Typology Code available for payment source | Source: Ambulatory Visit | Attending: Medical | Admitting: Medical

## 2013-12-14 DIAGNOSIS — R42 Dizziness and giddiness: Secondary | ICD-10-CM

## 2013-12-14 MED ORDER — AMOXICILLIN 500 MG PO TABS: ORAL_TABLET | ORAL | Status: DC

## 2013-12-14 MED ORDER — METHYLPREDNISOLONE (PAK) 4 MG PO TABS: ORAL_TABLET | ORAL | Status: DC

## 2013-12-16 ENCOUNTER — Encounter: Payer: Self-pay | Admitting: Medical

## 2014-01-27 ENCOUNTER — Emergency Department (HOSPITAL_COMMUNITY)
Admission: EM | Admit: 2014-01-27 | Discharge: 2014-01-27 | Disposition: A | Discharge status: Home / Self Care | Payer: No Typology Code available for payment source | Attending: Emergency Medicine | Admitting: Emergency Medicine

## 2014-01-27 ENCOUNTER — Encounter (HOSPITAL_COMMUNITY): Payer: Self-pay | Admitting: Emergency Medicine

## 2014-01-27 ENCOUNTER — Emergency Department (HOSPITAL_COMMUNITY): Payer: No Typology Code available for payment source

## 2014-01-27 DIAGNOSIS — J028 Acute pharyngitis due to other specified organisms: Secondary | ICD-10-CM

## 2014-01-27 DIAGNOSIS — B9689 Other specified bacterial agents as the cause of diseases classified elsewhere: Secondary | ICD-10-CM

## 2014-01-27 DIAGNOSIS — Z79899 Other long term (current) drug therapy: Secondary | ICD-10-CM | POA: Insufficient documentation

## 2014-01-27 DIAGNOSIS — J029 Acute pharyngitis, unspecified: Secondary | ICD-10-CM | POA: Insufficient documentation

## 2014-01-27 DIAGNOSIS — R000 Tachycardia, unspecified: Secondary | ICD-10-CM | POA: Insufficient documentation

## 2014-01-27 DIAGNOSIS — I10 Essential (primary) hypertension: Secondary | ICD-10-CM | POA: Insufficient documentation

## 2014-01-27 DIAGNOSIS — G8929 Other chronic pain: Secondary | ICD-10-CM | POA: Insufficient documentation

## 2014-01-27 DIAGNOSIS — R42 Dizziness and giddiness: Secondary | ICD-10-CM | POA: Insufficient documentation

## 2014-01-27 DIAGNOSIS — IMO0002 Reserved for concepts with insufficient information to code with codable children: Secondary | ICD-10-CM | POA: Insufficient documentation

## 2014-01-27 DIAGNOSIS — H9209 Otalgia, unspecified ear: Secondary | ICD-10-CM | POA: Insufficient documentation

## 2014-01-27 DIAGNOSIS — Z87828 Personal history of other (healed) physical injury and trauma: Secondary | ICD-10-CM | POA: Insufficient documentation

## 2014-01-27 DIAGNOSIS — Z792 Long term (current) use of antibiotics: Secondary | ICD-10-CM | POA: Insufficient documentation

## 2014-01-27 LAB — BASIC METABOLIC PANEL
BUN: 16 mg/dL (ref 6–23)
CALCIUM: 9.9 mg/dL (ref 8.4–10.5)
CO2: 21 mEq/L (ref 19–32)
Chloride: 99 mEq/L (ref 96–112)
Creatinine, Ser: 1.59 mg/dL — ABNORMAL HIGH (ref 0.50–1.35)
GFR calc Af Amer: 66 mL/min — ABNORMAL LOW (ref >90)
GFR calc non Af Amer: 57 mL/min — ABNORMAL LOW (ref >90)
GLUCOSE: 104 mg/dL — AB (ref 70–99)
Potassium: 3.7 mEq/L (ref 3.7–5.3)
SODIUM: 140 meq/L (ref 137–147)

## 2014-01-27 LAB — CBC
HEMATOCRIT: 45.5 % (ref 39.0–52.0)
HEMOGLOBIN: 15.2 g/dL (ref 13.0–17.0)
MCH: 27.5 pg (ref 26.0–34.0)
MCHC: 33.4 g/dL (ref 30.0–36.0)
MCV: 82.4 fL (ref 78.0–100.0)
Platelets: 284 10*3/uL (ref 150–400)
RBC: 5.52 MIL/uL (ref 4.22–5.81)
RDW: 13.8 % (ref 11.5–15.5)
WBC: 16.9 10*3/uL — ABNORMAL HIGH (ref 4.0–10.5)

## 2014-01-27 LAB — RAPID STREP SCREEN (MED CTR MEBANE ONLY): Streptococcus, Group A Screen (Direct): NEGATIVE

## 2014-01-27 LAB — MONONUCLEOSIS SCREEN: Mono Screen: NEGATIVE

## 2014-01-27 MED ORDER — SODIUM CHLORIDE 0.9 % IV BOLUS (SEPSIS)
1000.0000 mL | Freq: Once | INTRAVENOUS | Status: AC
  Administered 2014-01-27: 1000 mL via INTRAVENOUS

## 2014-01-27 MED ORDER — ACETAMINOPHEN 325 MG PO TABS
650.0000 mg | ORAL_TABLET | Freq: Once | ORAL | Status: AC
  Administered 2014-01-27: 650 mg via ORAL
  Filled 2014-01-27: qty 2

## 2014-01-27 MED ORDER — DEXAMETHASONE SODIUM PHOSPHATE 10 MG/ML IJ SOLN
10.0000 mg | Freq: Once | INTRAMUSCULAR | Status: AC
  Administered 2014-01-27: 10 mg via INTRAVENOUS
  Filled 2014-01-27: qty 1

## 2014-01-27 MED ORDER — OXYCODONE-ACETAMINOPHEN 5-325 MG PO TABS: 1.0000 | ORAL_TABLET | ORAL | Status: DC | PRN

## 2014-01-27 MED ORDER — IOHEXOL 300 MG/ML  SOLN
75.0000 mL | Freq: Once | INTRAMUSCULAR | Status: AC | PRN
  Administered 2014-01-27: 75 mL via INTRAVENOUS

## 2014-01-27 MED ORDER — CLINDAMYCIN HCL 150 MG PO CAPS: 450.0000 mg | ORAL_CAPSULE | Freq: Three times a day (TID) | ORAL | Status: DC

## 2014-01-27 MED ORDER — CLINDAMYCIN PHOSPHATE 900 MG/50ML IV SOLN
900.0000 mg | Freq: Once | INTRAVENOUS | Status: AC
  Administered 2014-01-27: 900 mg via INTRAVENOUS
  Filled 2014-01-27: qty 50

## 2014-01-27 NOTE — ED Notes (Signed)
CT paged about delay. 

## 2014-01-27 NOTE — ED Notes (Signed)
Patient transported to CT 

## 2014-01-27 NOTE — ED Notes (Signed)
Pt reports he woke approx 1100 this am with sore throat, frontal neck pain, headache, chills, and nausea. Pt reports increase pain when swallowing.

## 2014-01-27 NOTE — ED Provider Notes (Signed)
CSN: [id number]     Arrival date & time 01/27/14  1649 History  This chart was scribed for non-physician practitioner, Dierdre Forth, PA-C, working with Doug Sou, MD by Charline Bills, ED Scribe. This patient was seen in room TR08C/TR08C and the patient's care was started at 5:31 PM.    Chief Complaint  Patient presents with  . Sore Throat  . Neck Pain  . Headache   The history is provided by the patient and medical records.   HPI Comments: Harold Young is a 28 y.o. male who presents to the Emergency Department complaining of sore throat onset 10:30 AM today. Pt reports associated pain with swallowing, dizziness, mild bilateral ear pain and chills. He also reports mild, generalized, throbbing headache; no changes in vision. Patient reports he did not measure his temperature at home. He is taking no over-the-counter medications for these symptoms. Pt also has associated fever, ED temperature 102.4 F. Pt has not taken any medication for relief. No sick contacts. He reports anterior neck pain located around his throat, worse with swallowing but denies posterior neck pain or neck stiffness. Patient also denies rash, abdominal pain, nausea, vomiting or diarrhea, weakness, syncope, dysuria, hematuria.  Past Medical History  Diagnosis Date  . Hypertension   . Seasonal allergic rhinitis   . Chronic headaches   . Chronic back pain 2005    s/p injury at work   History reviewed. No pertinent past surgical history. Family History  Problem Relation Age of Onset  . Hypertension Mother   . Polycystic ovary syndrome Mother   . Diabetes Father   . Hypertension Father   . Bipolar disorder Sister   . Cancer Neg Hx   . Heart disease Neg Hx   . Stroke Neg Hx    History  Substance Use Topics  . Smoking status: Never Smoker   . Smokeless tobacco: Not on file  . Alcohol Use: No    Review of Systems  Constitutional: Positive for fever and chills.  HENT: Positive for ear pain and sore  throat.   Musculoskeletal: Positive for neck pain.  Neurological: Positive for dizziness and headaches.  All other systems reviewed and are negative.   Allergies  Review of patient's allergies indicates no known allergies.  Home Medications   Prior to Admission medications   Medication Sig Start Date End Date Taking? Authorizing Provider  amoxicillin (AMOXIL) 500 MG tablet 2 tablets po BID x 10 days 12/14/13   Kermit Balo Tysinger, PA-C  fluticasone [facility]) 50 MCG/ACT nasal spray Place into both nostrils daily.    Historical Provider, MD  ibuprofen (ADVIL,MOTRIN) 600 MG tablet Take 1 tablet (600 mg total) by mouth every 6 (six) hours as needed. 12/04/13   Derwood Kaplan, MD  meclizine (ANTIVERT) 25 MG tablet Take 1 tablet (25 mg total) by mouth 3 (three) times daily. 12/09/13   Kermit Balo Tysinger, PA-C  methylPREDNIsolone (MEDROL DOSPACK) 4 MG tablet follow package directions 12/14/13   Kermit Balo Tysinger, PA-C  Multiple Vitamin (MULTIVITAMIN WITH MINERALS) TABS tablet Take 1 tablet by mouth daily.    Historical Provider, MD  promethazine (PHENERGAN) 25 MG suppository Place 1 suppository (25 mg total) rectally every 6 (six) hours as needed for nausea or vomiting. 12/09/13   Kermit Balo Tysinger, PA-C  VALSARTAN-HYDROCHLOROTHIAZIDE PO Take 1 tablet by mouth daily.    Historical Provider, MD   Triage Vitals: BP 108/72  Pulse 128  Temp(Src) 102.4 F (39.1 C) (Oral)  Resp 18  SpO2 99%  Physical Exam  Nursing note and vitals reviewed. Constitutional: He is oriented to person, place, and time. He appears well-developed and well-nourished. No distress.  Awake, alert, nontoxic appearance  HENT:  Head: Normocephalic and atraumatic.  Right Ear: Tympanic membrane, external ear and ear canal normal.  Left Ear: Tympanic membrane, external ear and ear canal normal.  Nose: No mucosal edema or rhinorrhea. No epistaxis. Right sinus exhibits no maxillary sinus tenderness and no frontal sinus tenderness. Left sinus  exhibits no maxillary sinus tenderness and no frontal sinus tenderness.  Mouth/Throat: Uvula is midline and mucous membranes are normal. Mucous membranes are not pale and not cyanotic. Posterior oropharyngeal erythema present. No oropharyngeal exudate, posterior oropharyngeal edema or tonsillar abscesses.  Mild erythema without edema of tonsils and oropharynx; no evidence of peritonsillar abscess No vesicles, ulcerations or exudate  Patent airway Handling secretions without difficulty No stridor  Tender, discrete, mobile tonsillar and submental adenopathy  Tender, discrete, mobile superficial anterior cervical adenopathy   Eyes: Conjunctivae are normal. Pupils are equal, round, and reactive to light. No scleral icterus.  Neck: Normal range of motion and full passive range of motion without pain. Neck supple. No rigidity. No Kernig's sign noted.  Full ROM  Cardiovascular: Regular rhythm, normal heart sounds and intact distal pulses.  Tachycardia present.   Pulses:      Radial pulses are 2+ on the right side, and 2+ on the left side.       Dorsalis pedis pulses are 2+ on the right side, and 2+ on the left side.       Posterior tibial pulses are 2+ on the right side, and 2+ on the left side.  No tachycardia Capillary refill less than 3 seconds  Pulmonary/Chest: Effort normal and breath sounds normal. No accessory muscle usage or stridor. Not tachypneic. No respiratory distress. He has no decreased breath sounds. He has no wheezes. He has no rhonchi. He has no rales.  Clear and equal breath sounds  Abdominal: Soft. Normal appearance and bowel sounds are normal. He exhibits no mass. There is no tenderness. There is no rebound, no guarding and no CVA tenderness.  Abdomen soft and nontender  Musculoskeletal: Normal range of motion. He exhibits no edema.  Lymphadenopathy:       Head (right side): Submandibular and tonsillar adenopathy present. No submental, no preauricular, no posterior auricular  and no occipital adenopathy present.       Head (left side): Submandibular and tonsillar adenopathy present. No submental, no preauricular, no posterior auricular and no occipital adenopathy present.    He has cervical adenopathy.       Right cervical: Superficial cervical adenopathy present. No deep cervical and no posterior cervical adenopathy present.      Left cervical: Superficial cervical adenopathy present. No deep cervical and no posterior cervical adenopathy present.    He has no axillary adenopathy.  Neurological: He is alert and oriented to person, place, and time. He exhibits normal muscle tone. Coordination normal.  Speech is clear and goal oriented Moves extremities without ataxia  Skin: Skin is warm and dry. No rash noted. He is not diaphoretic.  Psychiatric: He has a normal mood and affect. His behavior is normal.    ED Course  Procedures (including critical care time) DIAGNOSTIC STUDIES: Oxygen Saturation is 99% on RA, normal by my interpretation.    COORDINATION OF CARE: 5:35 PM Discussed treatment plan with pt at bedside and pt agreed to plan.  Labs Review Labs Reviewed  CBC -  Abnormal; Notable for the following:    WBC 16.9 (*)    All other components within normal limits  BASIC METABOLIC PANEL - Abnormal; Notable for the following:    Glucose, Bld 104 (*)    Creatinine, Ser 1.59 (*)    GFR calc non Af Amer 57 (*)    GFR calc Af Amer 66 (*)    All other components within normal limits  RAPID STREP SCREEN  CULTURE, GROUP A STREP  MONONUCLEOSIS SCREEN    Imaging Review Ct Soft Tissue Neck W Contrast  01/27/2014   CLINICAL DATA:  Sore throat with neck pain and headache.  EXAM: CT NECK WITH CONTRAST  TECHNIQUE: Multidetector CT imaging of the neck was performed using the standard protocol following the bolus administration of intravenous contrast.  CONTRAST:  75mL OMNIPAQUE IOHEXOL 300 MG/ML  SOLN  COMPARISON:  None.  FINDINGS: Examination demonstrates mild  chronic inflammatory change over the floor of the maxillary sinuses left worse than right as well as the sphenoid sinus. Spaces of the suprahyoid neck are normal and symmetric without evidence of focal mass, adenopathy, focal fluid collection or inflammatory change. There is minimal prominence of the peritonsillar and adenoidal soft tissues with mild narrowing of the pharyngeal airway. No evidence of abscess. The epiglottis and subglottic airway are normal. The infrahyoid neck is within normal. Visualized superior mediastinum and lungs are normal.  IMPRESSION: Mild prominence of the a peritonsillar/adenoidal soft tissues with minimal compromise of the pharyngeal airway. No evidence of abscess.  Mild chronic sinus inflammatory disease.   Electronically Signed   By: Elberta Fortisaniel  Boyle M.D.   On: 01/27/2014 21:00     EKG Interpretation None      MDM   Final diagnoses:  Bacterial pharyngitis   Harold Young presents with fever, tachycardia and sore throat onset approximately 11 AM this morning. Patient reports no sick contacts that he does have children. On exam patent airway, handling secretions, mildly erythematous oropharynx without lesions, no edema or exudate. Rapid strep test pending. We'll give fever control, fluids and steroids.  Patient with moist mucous membranes, doubt dehydration. Patient with full range of motion of his neck and no midline or paraspinal tenderness; no petechiae purpura; doubt meningitis.  7:00PM Patient with significant improving vital signs. He is less ill appearing at this time. He reports he feels much better the pain in his throat persists.  Patient continues with tender anterior cervical lymphadenopathy. Will obtain CT scan, soft tissue neck.  9:30PM CT scan with mild prominence of the peritonsillar soft tissue with minimal compromise of the pharyngeal airway without evidence of abscess.  I personally reviewed the imaging tests through PACS system.  I reviewed available  ER/hospitalization records through the EMR.  Patient with peritonsillar cellulitis the CT scan and consistent with clinical exam. Patient will be given IV clindamycin here in the emergency department.  11:08 PM Patient continues with stable vital signs at this time. He reports that he feels much better.  Patient remains without nuchal rigidity throughout his time here in the emergency department. He has tolerated his clindamycin without evidence of allergic reaction. Patient will be discharged home with clindamycin by mouth and oxycodone for pain control. He is to followup with his primary care physician within 3 days to ensure further evaluation. He is to return to the emergency department for difficulty swallowing, difficulty breathing or increased fevers.  It has been determined that no acute conditions requiring further emergency intervention are present at this time.  The patient/guardian have been advised of the diagnosis and plan. We have discussed signs and symptoms that warrant return to the ED, such as changes or worsening in symptoms.   Vital signs are stable at discharge.   BP 123/69  Pulse 98  Temp(Src) 99.8 F (37.7 C) (Oral)  Resp 18  SpO2 97%  Patient/guardian has voiced understanding and agreed to follow-up with the PCP or specialist.  I personally performed the services described in this documentation, which was scribed in my presence. The recorded information has been reviewed and is accurate.    Dahlia Client Lenna Hagarty, PA-C 01/27/14 2311  Dierdre Forth, PA-C 01/27/14 2311

## 2014-01-27 NOTE — Discharge Instructions (Signed)
1. Medications: Clindamycin as your antibiotic, oxycodone for your sore throat at night, usual home medications 2. Treatment: rest, drink plenty of fluids,  3. Follow Up: Please followup with your primary doctor in 3 days for discussion of your diagnoses and further evaluation after today's visit;  Pharyngitis Pharyngitis is redness, pain, and swelling (inflammation) of your pharynx.  CAUSES  Pharyngitis is usually caused by infection. Most of the time, these infections are from viruses (viral) and are part of a cold. However, sometimes pharyngitis is caused by bacteria (bacterial). Pharyngitis can also be caused by allergies. Viral pharyngitis may be spread from person to person by coughing, sneezing, and personal items or utensils (cups, forks, spoons, toothbrushes). Bacterial pharyngitis may be spread from person to person by more intimate contact, such as kissing.  SIGNS AND SYMPTOMS  Symptoms of pharyngitis include:   Sore throat.   Tiredness (fatigue).   Low-grade fever.   Headache.  Joint pain and muscle aches.  Skin rashes.  Swollen lymph nodes.  Plaque-like film on throat or tonsils (often seen with bacterial pharyngitis). DIAGNOSIS  Your health care provider will ask you questions about your illness and your symptoms. Your medical history, along with a physical exam, is often all that is needed to diagnose pharyngitis. Sometimes, a rapid strep test is done. Other lab tests may also be done, depending on the suspected cause.  TREATMENT  Viral pharyngitis will usually get better in 3 4 days without the use of medicine. Bacterial pharyngitis is treated with medicines that kill germs (antibiotics).  HOME CARE INSTRUCTIONS   Drink enough water and fluids to keep your urine clear or pale yellow.   Only take over-the-counter or prescription medicines as directed by your health care provider:   If you are prescribed antibiotics, make sure you finish them even if you start  to feel better.   Do not take aspirin.   Get lots of rest.   Gargle with 8 oz of salt water ( tsp of salt per 1 qt of water) as often as every 1 2 hours to soothe your throat.   Throat lozenges (if you are not at risk for choking) or sprays may be used to soothe your throat. SEEK MEDICAL CARE IF:   You have large, tender lumps in your neck.  You have a rash.  You cough up green, yellow-brown, or bloody spit. SEEK IMMEDIATE MEDICAL CARE IF:   Your neck becomes stiff.  You drool or are unable to swallow liquids.  You vomit or are unable to keep medicines or liquids down.  You have severe pain that does not go away with the use of recommended medicines.  You have trouble breathing (not caused by a stuffy nose). MAKE SURE YOU:   Understand these instructions.  Will watch your condition.  Will get help right away if you are not doing well or get worse. Document Released: 09/08/2005 Document Revised: 06/29/2013 Document Reviewed: 05/16/2013 [facility] Patient Information 2014 [place], [place].

## 2014-01-28 NOTE — ED Provider Notes (Signed)
Medical screening examination/treatment/procedure(s) were performed by non-physician practitioner and as supervising physician I was immediately available for consultation/collaboration.   EKG Interpretation None       Verona Hartshorn, MD 01/28/14 0053 

## 2014-01-29 LAB — CULTURE, GROUP A STREP

## 2015-05-05 ENCOUNTER — Emergency Department (HOSPITAL_COMMUNITY)
Admission: EM | Admit: 2015-05-05 | Discharge: 2015-05-05 | Disposition: A | Discharge status: Home / Self Care | Payer: No Typology Code available for payment source | Attending: Emergency Medicine | Admitting: Emergency Medicine

## 2015-05-05 ENCOUNTER — Encounter (HOSPITAL_COMMUNITY): Payer: Self-pay | Admitting: *Deleted

## 2015-05-05 DIAGNOSIS — I10 Essential (primary) hypertension: Secondary | ICD-10-CM | POA: Insufficient documentation

## 2015-05-05 DIAGNOSIS — Z8709 Personal history of other diseases of the respiratory system: Secondary | ICD-10-CM | POA: Insufficient documentation

## 2015-05-05 DIAGNOSIS — Z87828 Personal history of other (healed) physical injury and trauma: Secondary | ICD-10-CM | POA: Insufficient documentation

## 2015-05-05 DIAGNOSIS — Z79899 Other long term (current) drug therapy: Secondary | ICD-10-CM | POA: Insufficient documentation

## 2015-05-05 DIAGNOSIS — M25531 Pain in right wrist: Secondary | ICD-10-CM | POA: Insufficient documentation

## 2015-05-05 DIAGNOSIS — G8929 Other chronic pain: Secondary | ICD-10-CM | POA: Insufficient documentation

## 2015-05-05 DIAGNOSIS — Z792 Long term (current) use of antibiotics: Secondary | ICD-10-CM | POA: Insufficient documentation

## 2015-05-05 DIAGNOSIS — Z7951 Long term (current) use of inhaled steroids: Secondary | ICD-10-CM | POA: Insufficient documentation

## 2015-05-05 NOTE — ED Notes (Signed)
Pt reports rt wrist pain of unknown cause.

## 2015-05-05 NOTE — ED Notes (Signed)
Declined W/C at D/C and was escorted to lobby by RN. 

## 2015-05-05 NOTE — Discharge Instructions (Signed)
Wrist Pain Take ibuprofen for pain. Follow up with your primary care physician. A wrist sprain happens when the bands of tissue that hold the wrist joints together (ligament) stretch too much or tear. A wrist strain happens when muscles or bands of tissue that connect muscles to bones (tendons) are stretched or pulled. HOME CARE  Put ice on the injured area.  Put ice in a plastic bag.  Place a towel between your skin and the bag.  Leave the ice on for 15-20 minutes, 03-04 times a day, for the first 2 days.  Raise (elevate) the injured wrist to lessen puffiness (swelling).  Rest the injured wrist for at least 48 hours or as told by your doctor.  Wear a splint, cast, or an elastic wrap as told by your doctor.  Only take medicine as told by your doctor.  Follow up with your doctor as told. This is important. GET HELP RIGHT AWAY IF:   The fingers are puffy, very red, white, or cold and blue.  The fingers lose feeling (numb) or tingle.  The pain gets worse.  It is hard to move the fingers. MAKE SURE YOU:   Understand these instructions.  Will watch your condition.  Will get help right away if you are not doing well or get worse. Document Released: 02/25/2008 Document Revised: 12/01/2011 Document Reviewed: 10/30/2010 [facility] Patient Information 2015 [place], [place]. This information is not intended to replace advice given to you by your health care provider. Make sure you discuss any questions you have with your health care provider.  Emergency Department Resource Guide 1) Find a Doctor and Pay Out of Pocket Although you won't have to find out who is covered by your insurance plan, it is a good idea to ask around and get recommendations. You will then need to call the office and see if the doctor you have chosen will accept you as a new patient and what types of options they offer for patients who are self-pay. Some doctors offer discounts or will set up payment plans for their  patients who do not have insurance, but you will need to ask so you aren't surprised when you get to your appointment.  2) Contact Your Local Health Department Not all health departments have doctors that can see patients for sick visits, but many do, so it is worth a call to see if yours does. If you don't know where your local health department is, you can check in your phone book. The CDC also has a tool to help you locate your state's health department, and many state websites also have listings of all of their local health departments.  3) Find a Walk-in Clinic If your illness is not likely to be very severe or complicated, you may want to try a walk in clinic. These are popping up all over the country in pharmacies, drugstores, and shopping centers. They're usually staffed by nurse practitioners or physician assistants that have been trained to treat common illnesses and complaints. They're usually fairly quick and inexpensive. However, if you have serious medical issues or chronic medical problems, these are probably not your best option.  No Primary Care Doctor: - Call Health Connect at  [phone] - they can help you locate a primary care doctor that  accepts your insurance, provides certain services, etc. - Physician Referral Service- [phone]  Chronic Pain Problems: Organization         Address  Phone   Notes  [facility]  [phone] Patients need to be referred by their primary care doctor.   Medication Assistance: Organization         Address  Phone   Notes  [facility] [address] [place] [place], [place] [address] [phone] --Must be a resident of [facility] -- Must have NO insurance coverage whatsoever (no Medicaid/ Medicare, etc.) -- The pt. MUST have a primary care doctor that directs their care regularly and follows them in the community   [facility]ist  [phone]   [employer]  [phone]     Agencies that provide inexpensive medical care: Organization         Address  Phone   Notes  [facility]  [phone]   [facility]    [phone]   [facility] [address] [place], [place] [address] [phone]   [facility] [address] [place], [place] [phone]   Planned Parenthood    [phone]   [facility]    [phone]   Community Health and [facility]  [address], [place] Phone:  [phone], Fax:  [phone] Hours of Operation:  9 am - 6 pm, M-F.  Also accepts Medicaid/Medicare and self-pay.  [facility]  [address], [place] Phone: [phone], Fax: [phone]. Hours of Operation:  8:30 am - 5:30 pm, M-F.  Also accepts Medicaid and self-pay.  [facility] [place] [address] [place] Phone: [phone]   [facility] [address] Natasha Bence [place], [place] [phone], Ext. [phone] Mondays & Thursdays: 7-9 AM.  First 15 patients are seen on a first come, first serve basis.    Medicaid-accepting [facility] Providers:  Organization         Address  Phone   Notes  [facility] [address],  [phone] Also accepts self-pay patients.  [facility] [address] Laurell Josephs [place], [place]  [phone]   [facility] [address], [place] [phone]   [facility] [address] [place] [phone]   Renaye Rakers [address], [place]   [phone] Only accepts [place] Access [place] patients after they have their name applied to their card.   Self-Pay (no insurance) in [facility]:  Organization         Address  Phone   Notes  Sickle Cell Patients, [facility] [address] [place], [place] [phone]    [facility] [address] [place], [place] [phone]   [facility]  [address], [place] [phone]   [facility]/Dr. Osei-Bonsu  [address] [place] or [address], [place] [phone] Phone number for both [place] and [place] locations is the same.  Urgent Medical and [facility] [address], [place] [phone]   [facility] [address] [place] or [address] [place] [address] [phone]   [facility] [address], [place] [phone], phone; [phone], fax Sees patients 1st and 3rd Saturday of every month.  Must not qualify for public or private insurance (i.e. Medicaid, Medicare, [place] Health Choice,  Veterans' Benefits)  Household income should be no more than 200% of the poverty level The clinic cannot treat you if you are pregnant or think you are pregnant  Sexually transmitted diseases are not treated at the clinic.    Dental Care: Organization         Address  Phone  Notes  [facility] Department of [facility] [address] [place], [place] [phone] Accepts children up to age 70 who are enrolled in [place] or [place] Health Choice; pregnant women with a Medicaid card; and children who have applied for Medicaid or [place] Health Choice, but were declined, whose parents can pay a reduced fee at time of service.  [facility] Department of [facility]  [address], [place] [phone] Accepts children up to age 63 who are enrolled in [place] or [place] Health Choice; pregnant women with a Medicaid card; and children who have applied for Medicaid or [place] Health Choice, but were declined, whose parents can pay a reduced fee at time of service.  [facility]  [address] [place], [place] [phone] Patients are seen by  appointment only. Walk-ins are not accepted. [facility] will see patients 43 years of age and older. Monday - Tuesday (8am-5pm) Most Wednesdays (8:30-5pm) $30 per visit, cash only  [facility]  [address], [facility] [phone] Patients are seen by appointment only. Walk-ins are not accepted. [facility] will see patients 42 years of age and older. One Wednesday Evening (Monthly: Volunteer Based).  $30 per visit, cash only  [employer]  [phone] for adults; Children under age 32, call Graduate Pediatric Dentistry at [phone]. Children aged 53-14, please call [phone] to request a pediatric application.  Dental services are provided in all areas of dental care including fillings, crowns and bridges, complete and partial dentures, implants, gum treatment, root canals, and extractions. Preventive care is also provided. Treatment is provided to both adults and children. Patients are selected via a lottery and there is often a waiting list.   [facility] [address], [place]  [phone] [email or web address]   [facility] [address] [place], [place] [phone], Ext. [phone] Second and Fourth Thursday of each month, opens at 6:30 AM; Clinic ends at 9 AM.  Patients are seen on a first-come first-served basis, and a limited number are seen during each clinic.   [facility]  [address] Ether Griffins [place], [place] [phone]   Eligibility Requirements You must have lived in [place], [place], or [place] counties for at least the last three months.   You cannot be eligible for state or federal sponsored [place], including [employer], [place], or [employer].   You generally cannot be eligible for healthcare insurance through your employer.    How to apply: Eligibility screenings are held every Tuesday and Wednesday afternoon from 1:00 pm until 4:00 pm. You  do not need an appointment for the interview!  [facility] [address] [place], [place] [phone]   [facility] Health Department  [phone]   [facility] Health Department  [phone]   [facility] Health Department  [phone]    Behavioral Health Resources in the Community: Intensive Outpatient Programs Organization         Address  Phone  Notes  [facility] [address] [place], [place] [phone]   [facility] Outpatient [address], [place], [place] [phone]   [facility] [address], [place], [place]  [phone]   [facility] [address],  [place], [place] [phone] or [phone]   Substance Abuse Resources Organization         Address  Phone  Notes  Al[facility] Drug Services  [phone]   [facility]  [phone]   [facility] [place]  [phone]   Floydene Flock  [phone]   Residential & Outpatient Substance Abuse Program  [phone]   Psychological Services Organization         Address  Phone  Notes  [facility]  [phone]   [facility] Services  [phone]   [facility] [address] [place] [phone] or [phone]    Mobile Crisis Teams Organization         Address  Phone  Notes  [facility], Mobile Crisis Care Unit  [phone]   [facility]  [address] [place], [place] [phone]   Doristine Locks [address] [place] [phone]    Self-Help/Support Groups Organization         Address  Phone             Notes  [facility] [place] - variety of support groups  [phone]- I7437963 Call for more information  Narcotics Anonymous (NA), [facility] [address], [employer] [place]  2 meetings at this location   Statistician          Address  Phone  Notes  [facility] [address],    [place]  [phone]   [facility]  [address] [place] [address], [place], [place] [phone]   [facility] [address] [place], [place] [phone] Admissions: 8am-3pm M-F  [facility] [address],    [place], [place] [phone]   [facility] [address] [place], [place], [place] [phone]   [facility] [address],  [place], [place] [phone]   [facility] - Intensive Outpatient [address] Laurell Joseph[address], [place], [place] [phone]   [facility] (Addiction Recovery [facility] [address]  [place], [place] [phone] or [phone]   [facility] ([facility]) [address], [place], [place] [phone] Accepts Medicaid  [facility] [address],  [place] [phone] Substance Abuse/Addiction Treatment   [facility] Organization         Address  Phone  Notes  [facility]  [phone]   Angie Fava, PhD [address] Ervin Knack [place], [place]   [phone] or [phone]   [facility] Behavioral   [address] [place] [address] [place], [place] [phone]   [facility] [address], [place], [place] [phone] Insurance/Medicaid/sponsorship through [facility] Families [address]                                    [place], [place] [phone] Therapy/tele-psych/case  [facility] [address], [place] [phone]    Dr. Lolly Mustache  [phone]   [facility]  [employer] [facility] Dept. 1) [address], [place] 2)  [place] 3)  [address], [place] [phone]  [phone]   [facility] Child Abuse Hotline [phone]) 310-3778 or [phone] (After Hours)

## 2015-05-05 NOTE — ED Provider Notes (Signed)
History  This chart was scribed for non-physician practitioner, Catha Gosselin, PA-C,working with Cathren Laine, MD, by Karle Plumber, ED Scribe. This patient was seen in room TR05C/TR05C and the patient's care was started at 12:42 PM.  Chief Complaint  Patient presents with  . Wrist Pain   The history is provided by the patient and medical records. No language interpreter was used.    HPI Comments:  Harold Young is a 29 y.o. male with chronic pain who presents to the Emergency Department complaining of severe right wrist pain that began yesterday. He reports he is works at [employer]. He denies taking anything for pain. Extension of the right hand makes the pain worse. He denies alleviating factors. He denies numbness, tingling or weakness of the right hand, wrist or arm, bruising or wounds. He denies any known trauma, injury or fall. Pt is right hand dominant.    Past Medical History  Diagnosis Date  . Hypertension   . Seasonal allergic rhinitis   . Chronic headaches   . Chronic back pain 2005    s/p injury at work   History reviewed. No pertinent past surgical history. Family History  Problem Relation Age of Onset  . Hypertension Mother   . Polycystic ovary syndrome Mother   . Diabetes Father   . Hypertension Father   . Bipolar disorder Sister   . Cancer Neg Hx   . Heart disease Neg Hx   . Stroke Neg Hx    Social History  Substance Use Topics  . Smoking status: Never Smoker   . Smokeless tobacco: None  . Alcohol Use: No    Review of Systems  Musculoskeletal: Positive for arthralgias.  Skin: Negative for color change.  Neurological: Negative for weakness and numbness.    Allergies  Review of patient's allergies indicates no known allergies.  Home Medications   Prior to Admission medications   Medication Sig Start Date End Date Taking? Authorizing Provider  clindamycin (CLEOCIN) 150 MG capsule Take 3  capsules (450 mg total) by mouth 3 (three) times daily. 01/27/14   Hannah Muthersbaugh, PA-C  fluticasone (FLONASE) 50 MCG/ACT nasal spray Place into both nostrils daily.    Historical Provider, MD  Multiple Vitamin (MULTIVITAMIN WITH MINERALS) TABS tablet Take 1 tablet by mouth daily.    Historical Provider, MD  oxyCODONE-acetaminophen (PERCOCET/ROXICET) 5-325 MG per tablet Take 1-2 tablets by mouth every 4 (four) hours as needed for severe pain. 01/27/14   Hannah Muthersbaugh, PA-C  valsartan-hydrochlorothiazide (DIOVAN-HCT) 320-25 MG per tablet Take 1 tablet by mouth daily.    Historical Provider, MD   Triage Vitals: BP 128/76 mmHg  Pulse 81  Temp(Src) 98.2 F (36.8 C) (Oral)  Resp 19  Ht 6\' 3"  (1.905 m)  Wt 250 lb (113.399 kg)  BMI 31.25 kg/m2  SpO2 100% Physical Exam  Constitutional: He is oriented to person, place, and time. He appears well-developed and well-nourished.  HENT:  Head: Normocephalic and atraumatic.  Eyes: EOM are normal.  Neck: Normal range of motion.  Cardiovascular: Normal rate.   Radial pulse 2+ of RUE  Pulmonary/Chest: Effort normal.  Musculoskeletal: Normal range of motion.  Able to flex and extend right wrist. No signs of de Quervain tendinopathy. NVI. Denies radiation of pain.  No anatomical snuff box tenderness.  No pallor to the fingers.    Neurological: He is alert and oriented to person, place, and time.  Neurovascularly intact  Skin: Skin is warm and dry.  Psychiatric: He has a normal mood and affect. His behavior is normal.  Nursing note and vitals reviewed.   ED Course  Procedures (including critical care time) DIAGNOSTIC STUDIES: Oxygen Saturation is 100% on RA, normal by my interpretation.   COORDINATION OF CARE: 12:47 PM- Will provide wrist splint and advised pt to take Ibuprofen. Pt verbalizes understanding and agrees to plan.  Medications - No data to display  Labs Review Labs Reviewed - No data to display  Imaging Review No results  found.    EKG Interpretation None      MDM   Final diagnoses:  Wrist pain, acute, right  Patient presents for wrist pain.  No deformity or signs of injury. He was given a wrist splint and follow up.  Patient verbally agrees with plan.  I personally performed the services described in this documentation, which was scribed in my presence. The recorded information has been reviewed and is accurate.    Catha Gosselin, PA-C 05/06/15 1191  Cathren Laine, MD 05/06/15 [phone]

## 2015-07-12 ENCOUNTER — Encounter (HOSPITAL_COMMUNITY): Payer: Self-pay | Admitting: Neurology

## 2015-07-12 ENCOUNTER — Emergency Department (HOSPITAL_COMMUNITY)
Admission: EM | Admit: 2015-07-12 | Discharge: 2015-07-12 | Disposition: A | Discharge status: Home / Self Care | Payer: No Typology Code available for payment source | Attending: Emergency Medicine | Admitting: Emergency Medicine

## 2015-07-12 ENCOUNTER — Emergency Department (HOSPITAL_COMMUNITY): Payer: No Typology Code available for payment source

## 2015-07-12 DIAGNOSIS — R109 Unspecified abdominal pain: Secondary | ICD-10-CM | POA: Insufficient documentation

## 2015-07-12 DIAGNOSIS — G8929 Other chronic pain: Secondary | ICD-10-CM | POA: Insufficient documentation

## 2015-07-12 DIAGNOSIS — I10 Essential (primary) hypertension: Secondary | ICD-10-CM | POA: Insufficient documentation

## 2015-07-12 DIAGNOSIS — Z79899 Other long term (current) drug therapy: Secondary | ICD-10-CM | POA: Insufficient documentation

## 2015-07-12 LAB — URINALYSIS, ROUTINE W REFLEX MICROSCOPIC
BILIRUBIN URINE: NEGATIVE
Glucose, UA: NEGATIVE mg/dL
Hgb urine dipstick: NEGATIVE
KETONES UR: NEGATIVE mg/dL
Leukocytes, UA: NEGATIVE
NITRITE: NEGATIVE
PROTEIN: NEGATIVE mg/dL
Specific Gravity, Urine: 1.019 (ref 1.005–1.030)
UROBILINOGEN UA: 0.2 mg/dL (ref 0.0–1.0)
pH: 5 (ref 5.0–8.0)

## 2015-07-12 LAB — COMPREHENSIVE METABOLIC PANEL
ALBUMIN: 3.9 g/dL (ref 3.5–5.0)
ALT: 36 U/L (ref 17–63)
AST: 30 U/L (ref 15–41)
Alkaline Phosphatase: 87 U/L (ref 38–126)
Anion gap: 7 (ref 5–15)
BUN: 13 mg/dL (ref 6–20)
CALCIUM: 9.6 mg/dL (ref 8.9–10.3)
CO2: 24 mmol/L (ref 22–32)
Chloride: 107 mmol/L (ref 101–111)
Creatinine, Ser: 1.58 mg/dL — ABNORMAL HIGH (ref 0.61–1.24)
GFR calc Af Amer: >60 mL/min (ref >60)
GFR calc non Af Amer: 57 mL/min — ABNORMAL LOW (ref >60)
Glucose, Bld: 84 mg/dL (ref 65–99)
POTASSIUM: 3.9 mmol/L (ref 3.5–5.1)
SODIUM: 138 mmol/L (ref 135–145)
Total Bilirubin: 1.1 mg/dL (ref 0.3–1.2)
Total Protein: 7.1 g/dL (ref 6.5–8.1)

## 2015-07-12 LAB — CBC
HEMATOCRIT: 43.6 % (ref 39.0–52.0)
Hemoglobin: 14.2 g/dL (ref 13.0–17.0)
MCH: 26.4 pg (ref 26.0–34.0)
MCHC: 32.6 g/dL (ref 30.0–36.0)
MCV: 81 fL (ref 78.0–100.0)
PLATELETS: 296 10*3/uL (ref 150–400)
RBC: 5.38 MIL/uL (ref 4.22–5.81)
RDW: 15.1 % (ref 11.5–15.5)
WBC: 5.7 10*3/uL (ref 4.0–10.5)

## 2015-07-12 LAB — LIPASE, BLOOD: LIPASE: 35 U/L (ref 11–51)

## 2015-07-12 MED ORDER — ACETAMINOPHEN 325 MG PO TABS: 650.0000 mg | ORAL_TABLET | Freq: Once | ORAL | Status: DC

## 2015-07-12 NOTE — ED Notes (Signed)
Pt reports middle right sided abd pain today that was sharp and severe. Has hx of same and tried to laid down to sleep when he woke up it was still there but less severe. Denies n/v/d. Is having intermittent sharp pains.

## 2015-07-12 NOTE — Discharge Instructions (Signed)
Abdominal Pain, Adult °Many things can cause belly (abdominal) pain. Most times, the belly pain is not dangerous. Many cases of belly pain can be watched and treated at home. °HOME CARE  °· Do not take medicines that help you go poop (laxatives) unless told to by your doctor. °· Only take medicine as told by your doctor. °· Eat or drink as told by your doctor. Your doctor will tell you if you should be on a special diet. °GET HELP IF: °· You do not know what is causing your belly pain. °· You have belly pain while you are sick to your stomach (nauseous) or have runny poop (diarrhea). °· You have pain while you pee or poop. °· Your belly pain wakes you up at night. °· You have belly pain that gets worse or better when you eat. °· You have belly pain that gets worse when you eat fatty foods. °· You have a fever. °GET HELP RIGHT AWAY IF:  °· The pain does not go away within 2 hours. °· You keep throwing up (vomiting). °· The pain changes and is only in the right or left part of the belly. °· You have bloody or tarry looking poop. °MAKE SURE YOU:  °· Understand these instructions. °· Will watch your condition. °· Will get help right away if you are not doing well or get worse. °  °This information is not intended to replace advice given to you by your health care provider. Make sure you discuss any questions you have with your health care provider. °  °Document Released: 02/25/2008 Document Revised: 09/29/2014 Document Reviewed: 05/18/2013 °Elsevier Interactive Patient Education ©2016 Elsevier Inc. ° °Emergency Department Resource Guide °1) Find a Doctor and Pay Out of Pocket °Although you won't have to find out who is covered by your insurance plan, it is a good idea to ask around and get recommendations. You will then need to call the office and see if the doctor you have chosen will accept you as a new patient and what types of options they offer for patients who are self-pay. Some doctors offer discounts or will set  up payment plans for their patients who do not have insurance, but you will need to ask so you aren't surprised when you get to your appointment. ° °2) Contact Your Local Health Department °Not all health departments have doctors that can see patients for sick visits, but many do, so it is worth a call to see if yours does. If you don't know where your local health department is, you can check in your phone book. The CDC also has a tool to help you locate your state's health department, and many state websites also have listings of all of their local health departments. ° °3) Find a Walk-in Clinic °If your illness is not likely to be very severe or complicated, you may want to try a walk in clinic. These are popping up all over the country in pharmacies, drugstores, and shopping centers. They're usually staffed by nurse practitioners or physician assistants that have been trained to treat common illnesses and complaints. They're usually fairly quick and inexpensive. However, if you have serious medical issues or chronic medical problems, these are probably not your best option. ° °No Primary Care Doctor: °- Call [facility] at  832-8000 - they can help you locate a primary care doctor that  accepts your insurance, provides certain services, etc. °- Physician Referral Service- 1-800-533-3463 ° °Chronic Pain Problems: °Organization           Address  Phone   Notes  °[facility]  (336) 297-2271 Patients need to be referred by their primary care doctor.  ° °Medication Assistance: °Organization         Address  Phone   Notes  °[place] [facility] [address] °[place], [place] [address] (336) 641-8030 --Must be a resident of [place] °-- Must have NO insurance coverage whatsoever (no Medicaid/ Medicare, etc.) °-- The pt. MUST have a primary care doctor that directs their care regularly and follows them in the community °  °[facility]  (866) 331-1348    °[employer]  (888) 892-1162   ° °Agencies that provide inexpensive medical care: °Organization         Address  Phone   Notes  °[facility]  (336) 832-8035   °[facility]    (336) 832-7272   °[facility] [address] °[place], [place] [address] (336) 832-4777   °[facility] [address], °[place] (336) 271-4999   °[facility]    (336) 373-0678   °[facility]    (336) 272-1050   °[facility] ° [address], [place] Phone:  (336) 832-4444, Fax:  (336) 832-4440 Hours of Operation:  9 am - 6 pm, M-F.  Also accepts Medicaid/Medicare and self-pay.  °[facility] ° [address], [place] Phone: (336) 832-3150, Fax: (336) 832-3151. Hours of Operation:  8:30 am - 5:30 pm, M-F.  Also accepts Medicaid and self-pay.  °[facility] [place] [address], [place] Phone: (336) 878-6027   °[facility] [address], [place], [place] (336)723-1848, Ext. 123 Mondays & Thursdays: 7-9 AM.  First 15 patients are seen on a first come, first serve basis. °  ° °Medicaid-accepting [place] Providers: ° °Organization         Address  Phone   Notes  °[facility] [address], [place] (336) 641-2100 Also accepts self-pay patients.  °[facility] [address], [place] ° (336) 856-9996   °[facility] [address], [place] (336) 288-8857   °[facility] [address] [place] [address], [place] (336) 299-7000   °Veita Bland [address], [place]  ° (336) 373-1557 Only accepts [place] Access Medicaid patients after they have their name applied to their card.  ° °Self-Pay (no insurance) in [place]: ° °Organization         Address  Phone   Notes  °Sickle Cell Patients, [facility] [address],  [place] (336) 832-1970   °[facility] [address], [place] (336) 832-4400   °[facility] [place] [facility] ° [address], [place] (336) 992-4800   °[facility]/Dr. Osei-Bonsu ° [address] [place] [address], [place] or [address], [place] (336) 841-8500 Phone number for both [place] and [place] locations is the same.  °[facility] [address], [place] (336) 299-0000   °[facility] [address] [place] [address], [place] or [address] (336) 852-7530 °(336) 878-2260   °[facility] [address], [place] (336) 350-1642, phone; (336) 294-5005, fax Sees patients 1st and 3rd Saturday of every month.  Must not qualify   for public or private insurance (i.e. Medicaid, Medicare, [place] Health Choice, Veterans' Benefits) • Household income should be no more than 200% of the poverty level •The clinic cannot treat you if you are pregnant or think you are pregnant • Sexually transmitted diseases are not treated at the clinic.  ° ° °Dental Care: °Organization         Address  Phone  Notes  °[facility] [facility] [address], [place] (336) 641-6152 Accepts children up to age 21 who are enrolled in Medicaid or [place] Health Choice; pregnant women with a Medicaid card; and children who have applied for Medicaid or [place] Health Choice, but were declined, whose parents can pay a reduced fee at time of service.  °[facility] [place]  [address], [place] (336) 641-7733 Accepts children up to age 21 who are enrolled in Medicaid or [place] Health Choice; pregnant women with a Medicaid card; and children who have applied for Medicaid or [place] Health Choice, but were declined, whose parents can pay a reduced fee at time of service.  °[facility] ° [address], [place] (336)  641-4533 Patients are seen by appointment only. Walk-ins are not accepted. [facility] will see patients 18 years of age and older. °Monday - Tuesday (8am-5pm) °Most Wednesdays (8:30-5pm) °$30 per visit, cash only  °[facility] ° [address], [place] (336) 641-4533 Patients are seen by appointment only. Walk-ins are not accepted. [facility] will see patients 18 years of age and older. °One Wednesday Evening (Monthly: Volunteer Based).  $30 per visit, cash only  °[facility]  (919) 537-3737 for adults; Children under age 4, call Graduate Pediatric Dentistry at (919) [phone]. Children aged 4-14, please call (919) [phone] to request a pediatric application. ° Dental services are provided in all areas of dental care including fillings, crowns and bridges, complete and partial dentures, implants, gum treatment, root canals, and extractions. Preventive care is also provided. Treatment is provided to both adults and children. °Patients are selected via a lottery and there is often a waiting list. °  °[facility] [address], °[place] ° (336) 763-8833 [email or web address] °  °[facility] [address], [place], [place] (336)723-1848, Ext. 123 Second and Fourth Thursday of each month, opens at 6:30 AM; Clinic ends at 9 AM.  Patients are seen on a first-come first-served basis, and a limited number are seen during each clinic.  ° °[facility] ° [address], [place], [place] (336) 723-7904   Eligibility Requirements °You must have lived in [place], [place], or [place] counties for at least the last three months. °  You cannot be eligible for state or federal sponsored healthcare insurance, including Veterans Administration, Medicaid, or Medicare. °  You generally cannot be eligible for healthcare insurance through your employer.  °  How to apply: °Eligibility screenings are held every Tuesday and Wednesday afternoon  from 1:00 pm until 4:00 pm. You do not need an appointment for the interview!  °[facility] [address], [place],  336-631-2330   °[facility] [facility]  336-342-8273   °[facility]  336-703-3100   °[facility]  336-570-6415   ° °Behavioral Health Resources in the Community: °Intensive Outpatient Programs °Organization         Address  Phone    Notes  °[facility] [address], [place], [place] 336-878-6098   °[facility] [address], [place], [place] 336-832-9800   °[facility] [address], [place], [place] ° 336-882-2125   °[facility] [address],  °[place], [place] 1-800-853-5163 or 336-641-4981   °Substance Abuse Resources °Organization         Address  Phone  Notes  °[facility]  336-882-2125   °[facility]  336-784-9470   °[facility]  336-285-9073   °[facility]  336-845-3988   °Residential & Outpatient Substance Abuse Program  1-800-659-3381   °Psychological Services °Organization         Address  Phone  Notes  °[facility]  336- 832-9600   °[facility]  336- 378-7881   °[facility] [address], [place] 1-800-853-5163 or 336-641-4981   ° °Mobile Crisis Teams °Organization         Address  Phone  Notes  °[facility], Mobile Crisis Care Unit  1-877-626-1772   °Assertive °[facility] ° [address] [place], [place] 336-834-9664   °Sharon DeEsch [address] °[place] 336-554-5454   ° °Self-Help/Support Groups °Organization         Address  Phone             Notes  °[facility] [place] - variety of support groups  336- 373-1402 Call for more information  °Narcotics Anonymous (NA), [facility] [address], °[place]  2 meetings at this location  ° °Residential Treatment  Programs °Organization         Address  Phone  Notes  °[facility] [address],    °[place]  1-866-801-8205   °[facility] ° [address], [place], [place] 704-293-8524   °[facility] [address], [place] 336-845-3988 Admissions: 8am-3pm M-F  °[facility] [address]    °[place], [place] 336-841-1104   °[facility] [address], [place], [place] 336-379-7146   °[facility] [address]  °[place], [place] 336-285-9073   °[facility] - Intensive Outpatient [address], [place], [place] 336-852-3033   °[facility] [address]  °[place], [place] 1-877-615-2722 or 336-784-9470   °[facility] [address] [place] [address] [place], [place] 336-227-7417 Accepts Medicaid  °[facility] [address]  °[place] 1-800-659-3381 Substance Abuse/Addiction Treatment  ° °[place] Behavioral Health Resources °Organization         Address  Phone  Notes  °[facility]  (888) 581-9988   °Julie Brannon, PhD [address] [place], [place]   (336) 349-5553 or (336) 951-0000   °[facility]   [address] °[place], [place] (336) 349-4454   °[facility] [address], [place], [place] (336) 342-8316 Insurance/Medicaid/sponsorship through [facility]  °[facility] [address]                                    [place],  (336) 342-8316 Therapy/tele-psych/case  °[facility] [address]  ° [place],  (336) 349-2233    °Dr. Arfeen  (336) 349-4544   °[facility] [place]  [employer] [place]   [facility] 1) [address], [place] °2) [address], [place] °3)  [address], [place] (336) 349-3220 °(336) 342-7768 ° °(336) 342-8140   °[place] [facility] (336) 342-1394 or (336) 342-3537 (After Hours)    ° ° ° °

## 2015-07-12 NOTE — ED Provider Notes (Signed)
CSN: [id number]     Arrival date & time 07/12/15  1527 History   First MD Initiated Contact with Patient 07/12/15 1706     Chief Complaint  Patient presents with  . Abdominal Pain     (Consider location/radiation/quality/duration/timing/severity/associated sxs/prior Treatment) Patient is a 29 y.o. male presenting with abdominal pain. The history is provided by the patient and the spouse. No language interpreter was used.  Abdominal Pain Associated symptoms: no chest pain, no chills, no fever and no nausea   Mr. Fuson is a 29 y.o male with a history of polycystic kidney disease, hypertension, chronic headaches and chronic back pain who presents for right-sided abdominal pain that began suddenly about 3 hours ago. He describes the pain as, "catching." He states he has had this several times in the past but it usually resolves in 5 minutes. This time he states he was worried because it had been over 45 minutes. He denies any treatment prior to arrival. He denies any previous abdominal surgeries. He denies any fever, chills, chest pain, shortness of breath, nausea, vomiting, diarrhea, dysuria. He denies any alcohol or drug use.   Past Medical History  Diagnosis Date  . Hypertension   . Seasonal allergic rhinitis   . Chronic headaches   . Chronic back pain 2005    s/p injury at work   History reviewed. No pertinent past surgical history. Family History  Problem Relation Age of Onset  . Hypertension Mother   . Polycystic ovary syndrome Mother   . Diabetes Father   . Hypertension Father   . Bipolar disorder Sister   . Cancer Neg Hx   . Heart disease Neg Hx   . Stroke Neg Hx    Social History  Substance Use Topics  . Smoking status: Never Smoker   . Smokeless tobacco: None  . Alcohol Use: No    Review of Systems  Constitutional: Negative for fever and chills.  Cardiovascular: Negative for chest pain.  Gastrointestinal: Positive for abdominal pain. Negative for nausea.  All  other systems reviewed and are negative.     Allergies  Review of patient's allergies indicates no known allergies.  Home Medications   Prior to Admission medications   Medication Sig Start Date End Date Taking? Authorizing Provider  Multiple Vitamin (MULTIVITAMIN WITH MINERALS) TABS tablet Take 1 tablet by mouth daily.   Yes Historical Provider, MD  valsartan-hydrochlorothiazide (DIOVAN-HCT) 320-25 MG per tablet Take 1 tablet by mouth daily.   Yes Historical Provider, MD   BP 124/70 mmHg  Pulse 59  Temp(Src) 97.8 F (36.6 C) (Oral)  Resp 16  SpO2 100% Physical Exam  Constitutional: He is oriented to person, place, and time. He appears well-developed and well-nourished.  HENT:  Head: Normocephalic and atraumatic.  Eyes: Conjunctivae are normal.  Neck: Normal range of motion. Neck supple.  Cardiovascular: Normal rate, regular rhythm and normal heart sounds.   Pulmonary/Chest: Effort normal and breath sounds normal.  Lungs clear to auscultation bilaterally. No wheezing or decreased breath sounds.  Abdominal: Soft. Normal appearance. He exhibits no distension. There is no tenderness. There is no rebound and no guarding.    Mild tenderness as diagrammed. No guarding or rebound. No abdominal distention. Abdomen appears normal.  Musculoskeletal: Normal range of motion.  Neurological: He is alert and oriented to person, place, and time.  Skin: Skin is warm and dry.  Nursing note and vitals reviewed.   ED Course  Procedures (including critical care time) Labs Review Labs Reviewed  COMPREHENSIVE METABOLIC PANEL - Abnormal; Notable for the following:    Creatinine, Ser 1.58 (*)    GFR calc non Af Amer 57 (*)    All other components within normal limits  LIPASE, BLOOD  CBC  URINALYSIS, ROUTINE W REFLEX MICROSCOPIC (NOT AT [facility])    Imaging Review [place] Abdomen Limited  07/12/2015  CLINICAL DATA:  Onset of sharp and severe right-sided abdominal pain today. EXAM: US ABDOMEN  LIMITED - RIGHT UPPER QUADRANT COMPARISON:  None. FINDINGS: Gallbladder: No gallstones or wall thickening visualized. No sonographic Murphy sign noted. Common bile duct: Diameter: 2.8 mm Liver: Normal echogenicity without focal lesion or biliary dilatation. IMPRESSION: Normal right upper quadrant ultrasound examination. Electronically Signed   By: Rudie MeyerP.  Gallerani M.D.   On: 07/12/2015 19:17   I have personally reviewed and evaluated these lab results as part of my medical decision-making.   EKG Interpretation None      MDM   Final diagnoses:  Right sided abdominal pain   Patient presents for right sided abdominal pain that began a few hours ago. His labs are unremarkable. I'm not concerned for any gallbladder issues such as cholelithiasis or cholecystitis. I'm not concerned about bowel obstruction, pancreatitis, or gastritis. He has no nausea, vomiting, or diarrhea. His last bowel movement was today. Patient and wife request ultrasound of the gallbladder which is negative for cholecystitis or cholelithiasis.  His vital signs are stable and he is well-appearing. I'm not sure what is causing his pain but I discussed return precautions with the patient as well as follow-up and he verbally agrees with the plan.     Catha GosselinHanna Patel-Mills, PA-C 07/12/15 2033  Mancel BaleElliott [place], MD 07/12/15 [id number]

## 2015-09-23 ENCOUNTER — Encounter (HOSPITAL_COMMUNITY): Payer: Self-pay | Admitting: Emergency Medicine

## 2015-09-23 DIAGNOSIS — Y998 Other external cause status: Secondary | ICD-10-CM | POA: Insufficient documentation

## 2015-09-23 DIAGNOSIS — X500XXA Overexertion from strenuous movement or load, initial encounter: Secondary | ICD-10-CM | POA: Insufficient documentation

## 2015-09-23 DIAGNOSIS — S39012A Strain of muscle, fascia and tendon of lower back, initial encounter: Secondary | ICD-10-CM | POA: Insufficient documentation

## 2015-09-23 DIAGNOSIS — Y9289 Other specified places as the place of occurrence of the external cause: Secondary | ICD-10-CM | POA: Insufficient documentation

## 2015-09-23 DIAGNOSIS — Z79899 Other long term (current) drug therapy: Secondary | ICD-10-CM | POA: Insufficient documentation

## 2015-09-23 DIAGNOSIS — Y9389 Activity, other specified: Secondary | ICD-10-CM | POA: Insufficient documentation

## 2015-09-23 DIAGNOSIS — M5432 Sciatica, left side: Secondary | ICD-10-CM | POA: Insufficient documentation

## 2015-09-23 DIAGNOSIS — I10 Essential (primary) hypertension: Secondary | ICD-10-CM | POA: Insufficient documentation

## 2015-09-23 DIAGNOSIS — G8929 Other chronic pain: Secondary | ICD-10-CM | POA: Insufficient documentation

## 2015-09-23 NOTE — ED Notes (Signed)
C/o L lower back pain since lifting multiple living room sets while working on Wednesday.  Pt works for [employer].  Denies urinary complaints.

## 2015-09-24 ENCOUNTER — Emergency Department (HOSPITAL_COMMUNITY)
Admission: EM | Admit: 2015-09-24 | Discharge: 2015-09-24 | Disposition: A | Discharge status: Home / Self Care | Payer: No Typology Code available for payment source | Attending: Emergency Medicine | Admitting: Emergency Medicine

## 2015-09-24 DIAGNOSIS — M5432 Sciatica, left side: Secondary | ICD-10-CM

## 2015-09-24 DIAGNOSIS — S39012A Strain of muscle, fascia and tendon of lower back, initial encounter: Secondary | ICD-10-CM

## 2015-09-24 MED ORDER — CYCLOBENZAPRINE HCL 10 MG PO TABS: 10.0000 mg | ORAL_TABLET | Freq: Two times a day (BID) | ORAL | Status: DC | PRN

## 2015-09-24 MED ORDER — HYDROCODONE-ACETAMINOPHEN 5-325 MG PO TABS: 1.0000 | ORAL_TABLET | Freq: Once | ORAL | Status: DC

## 2015-09-24 MED ORDER — CYCLOBENZAPRINE HCL 10 MG PO TABS: 10.0000 mg | ORAL_TABLET | Freq: Once | ORAL | Status: DC

## 2015-09-24 NOTE — ED Notes (Signed)
See PA assessment notes.  

## 2015-09-24 NOTE — ED Provider Notes (Signed)
CSN: [id number]     Arrival date & time 09/23/15  2132 History   None    Chief Complaint  Patient presents with  . Back Pain     (Consider location/radiation/quality/duration/timing/severity/associated sxs/prior Treatment) Patient is a 30 y.o. male presenting with back pain. The history is provided by the patient.  Back Pain Location:  Lumbar spine Quality:  Aching and shooting Pain severity:  Moderate Onset quality:  Gradual Duration:  4 days Timing:  Constant Progression:  Worsening Chronicity:  New Context: lifting heavy objects and physical stress   Worsened by:  Ambulation, movement and bending Associated symptoms: no bladder incontinence, no bowel incontinence and no dysuria    Lucilla LameMarvin Birkland is a 30 y.o. 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 He denies any other injuries.   Past Medical History  Diagnosis Date  . Hypertension   . Seasonal allergic rhinitis   . Chronic headaches   . Chronic back pain 2005    s/p injury at work   History reviewed. No pertinent past surgical history. Family History  Problem Relation Age of Onset  . Hypertension Mother   . Polycystic ovary syndrome Mother   . Diabetes Father   . Hypertension Father   . Bipolar disorder Sister   . Cancer Neg Hx   . Heart disease Neg Hx   . Stroke Neg Hx    Social History  Substance Use Topics  . Smoking status: Never Smoker   . Smokeless tobacco: None  . Alcohol Use: No    Review of Systems  Gastrointestinal: Negative for bowel incontinence.  Genitourinary: Negative for bladder incontinence and dysuria.  Musculoskeletal: Positive for back pain.      Allergies  Review of patient's allergies indicates no known allergies.  Home Medications   Prior to Admission medications   Medication Sig Start Date End Date Taking? Authorizing Provider  cyclobenzaprine (FLEXERIL) 10 MG tablet  Take 1 tablet (10 mg total) by mouth 2 (two) times daily as needed for muscle spasms. 09/24/15   Hope Orlene OchM Neese, NP  Multiple Vitamin (MULTIVITAMIN WITH MINERALS) TABS tablet Take 1 tablet by mouth daily.    Historical Provider, MD  valsartan-hydrochlorothiazide (DIOVAN-HCT) 320-25 MG per tablet Take 1 tablet by mouth daily.    Historical Provider, MD   BP 143/85 mmHg  Pulse 73  Temp(Src) 97.5 F (36.4 C) (Oral)  Resp 14  Ht 6\' 2"  (1.88 m)  Wt 109.572 kg  BMI 31.00 kg/m2  SpO2 100% Physical Exam  Constitutional: He is oriented to person, place, and time. He appears well-developed and well-nourished. No distress.  HENT:  Head: Normocephalic and atraumatic.  Eyes: EOM are normal. Pupils are equal, round, and reactive to light.  Neck: Normal range of motion. Neck supple.  Cardiovascular: Normal rate, regular rhythm and intact distal pulses.   Pulmonary/Chest: Effort normal and breath sounds normal. No respiratory distress.  Abdominal: Soft. Bowel sounds are normal. There is no tenderness.  Musculoskeletal: Normal range of motion. He exhibits no edema.       Lumbar back: He exhibits tenderness and spasm. He exhibits normal range of motion, no deformity and normal pulse.       Back:  Neurological: He is alert and oriented to person, place, and time. He has normal strength. No cranial nerve deficit or sensory deficit. Coordination and gait normal.  Reflex Scores:  Bicep reflexes are 2+ on the right side and 2+ on the left side.      Brachioradialis reflexes are 2+ on the right side and 2+ on the left side.      Patellar reflexes are 2+ on the right side and 2+ on the left side.      Achilles reflexes are 2+ on the right side and 2+ on the left side. Skin: Skin is warm and dry.  Psychiatric: He has a normal mood and affect. His behavior is normal.  Nursing note and vitals reviewed.   ED Course  Procedures   MDM  30 y.o. male with left lower back pain after lifting furniture at work  4 days ago. Stable for d/c without neuro deficits. No red flags to suggest need for emergent neuro consult. No tenderness over the spine to suggest need for x-rays. Discussed with the patient plan of care and all questioned fully answered. He will follow up with ortho or return here if any problems arise.   Final diagnoses:  Sciatica, left       Janne Napoleon, NP 09/24/15 0028  Melene Plan, DO 09/24/15 1832

## 2015-09-24 NOTE — Discharge Instructions (Signed)
Do not drive while taking the muscle relaxant as it will make you sleepy. °

## 2015-09-27 ENCOUNTER — Encounter (HOSPITAL_COMMUNITY): Payer: Self-pay | Admitting: Emergency Medicine

## 2015-09-27 ENCOUNTER — Emergency Department (HOSPITAL_COMMUNITY)
Admission: EM | Admit: 2015-09-27 | Discharge: 2015-09-27 | Disposition: A | Discharge status: Home / Self Care | Payer: No Typology Code available for payment source | Attending: Emergency Medicine | Admitting: Emergency Medicine

## 2015-09-27 DIAGNOSIS — M5442 Lumbago with sciatica, left side: Secondary | ICD-10-CM | POA: Insufficient documentation

## 2015-09-27 DIAGNOSIS — Q613 Polycystic kidney, unspecified: Secondary | ICD-10-CM | POA: Insufficient documentation

## 2015-09-27 DIAGNOSIS — I10 Essential (primary) hypertension: Secondary | ICD-10-CM | POA: Insufficient documentation

## 2015-09-27 DIAGNOSIS — G8929 Other chronic pain: Secondary | ICD-10-CM | POA: Insufficient documentation

## 2015-09-27 DIAGNOSIS — Z79899 Other long term (current) drug therapy: Secondary | ICD-10-CM | POA: Insufficient documentation

## 2015-09-27 HISTORY — DX: Sciatica, unspecified side: M54.30

## 2015-09-27 MED ORDER — METHYLPREDNISOLONE SODIUM SUCC 125 MG IJ SOLR
125.0000 mg | Freq: Once | INTRAMUSCULAR | Status: AC
  Administered 2015-09-27: 125 mg via INTRAMUSCULAR
  Filled 2015-09-27: qty 2

## 2015-09-27 MED ORDER — PREDNISONE 10 MG PO TABS: ORAL_TABLET | ORAL | Status: DC

## 2015-09-27 NOTE — Discharge Instructions (Signed)

## 2015-09-27 NOTE — ED Provider Notes (Signed)
CSN: [id number]     Arrival date & time 09/27/15  1929 History  By signing my name below, I, Emmanuella Mensah, attest that this documentation has been prepared under the direction and in the presence of Cheron SchaumannLeslie Sofia, PA-C. Electronically Signed: Angelene GiovanniEmmanuella Mensah, ED Scribe. 09/27/2015. 8:29 PM.     Chief Complaint  Patient presents with  . Back Pain   The history is provided by the patient. No language interpreter was used.   HPI Comments: Harold Young is a 30 y.o. male with a hx of Polycystic kidney disease and sciatica who presents to the Emergency Department complaining of gradually worsening lower left back pain that radiates toward his left leg onset one week ago after work. He explains that the pain is worse when he bends. 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 No alleviating factors noted. Pt was seen here on 09/23/15 and advised to follow up with Ortho but he has not been able to. He reports that his kidney level is 1.5 and he denies a hx of renal failure.    Past Medical History  Diagnosis Date  . Hypertension   . Seasonal allergic rhinitis   . Chronic headaches   . Chronic back pain 2005    s/p injury at work  . Sciatica    History reviewed. No pertinent past surgical history. Family History  Problem Relation Age of Onset  . Hypertension Mother   . Polycystic ovary syndrome Mother   . Diabetes Father   . Hypertension Father   . Bipolar disorder Sister   . Cancer Neg Hx   . Heart disease Neg Hx   . Stroke Neg Hx    Social History  Substance Use Topics  . Smoking status: Never Smoker   . Smokeless tobacco: None  . Alcohol Use: No    Review of Systems  Genitourinary: Negative for dysuria and hematuria.  Musculoskeletal: Positive for back pain and arthralgias.  All other systems reviewed and are negative.     Allergies  Review of patient's allergies indicates no known  allergies.  Home Medications   Prior to Admission medications   Medication Sig Start Date End Date Taking? Authorizing Provider  cyclobenzaprine (FLEXERIL) 10 MG tablet Take 1 tablet (10 mg total) by mouth 2 (two) times daily as needed for muscle spasms. 09/24/15   Hope Orlene OchM Neese, NP  Multiple Vitamin (MULTIVITAMIN WITH MINERALS) TABS tablet Take 1 tablet by mouth daily.    Historical Provider, MD  valsartan-hydrochlorothiazide (DIOVAN-HCT) 320-25 MG per tablet Take 1 tablet by mouth daily.    Historical Provider, MD   BP 135/84 mmHg  Pulse 88  Temp(Src) 97.5 F (36.4 C) (Oral)  Resp 20  SpO2 99% Physical Exam  Constitutional: He is oriented to person, place, and time. He appears well-developed and well-nourished. No distress.  HENT:  Head: Normocephalic and atraumatic.  Eyes: Conjunctivae and EOM are normal.  Neck: Neck supple. No tracheal deviation present.  Cardiovascular: Normal rate.   Pulmonary/Chest: Effort normal. No respiratory distress.  Musculoskeletal: Normal range of motion. He exhibits tenderness.  Diffusely tender lower lumbar and sacral spine Tender left sciatic notch  Neurological: He is alert and oriented to person, place, and time.  Skin: Skin is warm and dry.  Psychiatric: He has a normal mood and affect. His behavior is normal.  Nursing note and vitals reviewed.   ED Course  Procedures (including critical care time) DIAGNOSTIC STUDIES: Oxygen Saturation is 99% on RA, normal by my interpretation.    COORDINATION OF CARE: 8:20 PM- Pt advised of plan for treatment and pt agrees. Pt will receive prednisone.    MDM   Final diagnoses:  Left-sided low back pain with left-sided sciatica    Pt given Solumedrol IM.   Prednisone taper Follow up with Dr. Ophelia Charter in pain persist.   Lonia Skinner [place], PA-C 09/27/15 2051  Laurence Spates, MD 09/28/15 [phone]

## 2015-09-27 NOTE — ED Notes (Signed)
Pt. reports chronic left lower back pain radiating to left leg worse when bending , movement and changing positions , diagnosed with sciatica this week prescribed with medications with no relief , denies recent injury /ambulatory , pt. stated heavy lifting at work.

## 2017-08-02 ENCOUNTER — Emergency Department (HOSPITAL_COMMUNITY)
Admission: EM | Admit: 2017-08-02 | Discharge: 2017-08-03 | Disposition: A | Discharge status: Home / Self Care | Payer: Managed Care, Other (non HMO) | Attending: Emergency Medicine | Admitting: Emergency Medicine

## 2017-08-02 ENCOUNTER — Encounter (HOSPITAL_COMMUNITY): Payer: Self-pay

## 2017-08-02 ENCOUNTER — Emergency Department (HOSPITAL_COMMUNITY): Payer: Managed Care, Other (non HMO)

## 2017-08-02 DIAGNOSIS — Y92009 Unspecified place in unspecified non-institutional (private) residence as the place of occurrence of the external cause: Secondary | ICD-10-CM | POA: Insufficient documentation

## 2017-08-02 DIAGNOSIS — I10 Essential (primary) hypertension: Secondary | ICD-10-CM | POA: Diagnosis not present

## 2017-08-02 DIAGNOSIS — Y9301 Activity, walking, marching and hiking: Secondary | ICD-10-CM | POA: Insufficient documentation

## 2017-08-02 DIAGNOSIS — Y999 Unspecified external cause status: Secondary | ICD-10-CM | POA: Insufficient documentation

## 2017-08-02 DIAGNOSIS — Z79899 Other long term (current) drug therapy: Secondary | ICD-10-CM | POA: Diagnosis not present

## 2017-08-02 DIAGNOSIS — S9032XA Contusion of left foot, initial encounter: Secondary | ICD-10-CM | POA: Insufficient documentation

## 2017-08-02 DIAGNOSIS — S99922A Unspecified injury of left foot, initial encounter: Secondary | ICD-10-CM | POA: Diagnosis present

## 2017-08-02 DIAGNOSIS — G8929 Other chronic pain: Secondary | ICD-10-CM | POA: Diagnosis not present

## 2017-08-02 DIAGNOSIS — W2209XA Striking against other stationary object, initial encounter: Secondary | ICD-10-CM | POA: Diagnosis not present

## 2017-08-02 MED ORDER — TRAMADOL HCL 50 MG PO TABS: 50.0000 mg | ORAL_TABLET | Freq: Four times a day (QID) | ORAL | 0 refills | Status: DC | PRN

## 2017-08-02 MED ORDER — ACETAMINOPHEN 500 MG PO TABS
1000.0000 mg | ORAL_TABLET | Freq: Once | ORAL | Status: AC
  Administered 2017-08-03: 1000 mg via ORAL
  Filled 2017-08-02: qty 2

## 2017-08-02 NOTE — ED Provider Notes (Signed)
[facility] Provider Note   CSN: [id number] Arrival date & time: 08/02/17  2134     History   Chief Complaint Chief Complaint  Patient presents with  . Foot Pain    HPI Harold Young is a 31 y.o. male with a hx of back pain, hypertension, CKD, sciatica presents to the Emergency Department complaining of gradual, persistent, progressively worsening left foot pain onset yesterday after stepping on a toy while walking barefoot inside the house.  He denies open wound or bleeding at the time of injury.  He reports no significant pain at that time but throughout the night while he was at work his symptoms worsened.  Patient reports Tylenol without relief.  He is unable to take ibuprofen due to his chronic kidney disease.  Patient reports associated "blood blister" noted to the bottom of his foot.  Walking makes his symptoms better.  Nothing seems to make them worse.  Patient denies fever, chills, bleeding, open wounds, numbness, tingling, weakness.  Patient denies HIV, immunocompromise or diabetes.   The history is provided by the patient and medical records. No language interpreter was used.    Past Medical History:  Diagnosis Date  . Chronic back pain 2005   s/p injury at work  . Chronic headaches   . Hypertension   . Sciatica   . Seasonal allergic rhinitis     Patient Active Problem List   Diagnosis Date Noted  . Hypertension 01/10/2013    History reviewed. No pertinent surgical history.     Home Medications    Prior to Admission medications   Medication Sig Start Date End Date Taking? Authorizing Provider  cyclobenzaprine (FLEXERIL) 10 MG tablet Take 1 tablet (10 mg total) by mouth 2 (two) times daily as needed for muscle spasms. 09/24/15   Janne NapoleonNeese, Hope M, NP  Multiple Vitamin (MULTIVITAMIN WITH MINERALS) TABS tablet Take 1 tablet by mouth daily.    [provider]  predniSONE (DELTASONE) 10 MG tablet 6,5,4,3,2,1 taper  09/27/15   Elson AreasSofia, Leslie K, PA-C  traMADol (ULTRAM) 50 MG tablet Take 1 tablet (50 mg total) every 6 (six) hours as needed by mouth for moderate pain. 08/02/17   Mabelle Mungin, Dahlia ClientHannah, PA-C  valsartan-hydrochlorothiazide (DIOVAN-HCT) 320-25 MG per tablet Take 1 tablet by mouth daily.    [provider]    Family History Family History  Problem Relation Age of Onset  . Hypertension Mother   . Polycystic ovary syndrome Mother   . Diabetes Father   . Hypertension Father   . Bipolar disorder Sister   . Cancer Neg Hx   . Heart disease Neg Hx   . Stroke Neg Hx     Social History Social History   Tobacco Use  . Smoking status: Never Smoker  . Smokeless tobacco: Never Used  Substance Use Topics  . Alcohol use: No  . Drug use: No     Allergies   Patient has no known allergies.   Review of Systems Review of Systems  Constitutional: Negative for chills and fever.  Gastrointestinal: Negative for nausea and vomiting.  Musculoskeletal: Positive for arthralgias. Negative for back pain, joint swelling, neck pain and neck stiffness.  Skin: Positive for color change. Negative for wound.  Neurological: Negative for numbness.  Hematological: Does not bruise/bleed easily.  Psychiatric/Behavioral: The patient is not nervous/anxious.   All other systems reviewed and are negative.    Physical Exam Updated Vital Signs BP (!) 156/100   Pulse 63  Temp 98.7 F (37.1 C)   Resp 20   SpO2 97%   Physical Exam  Constitutional: He appears well-developed and well-nourished. No distress.  HENT:  Head: Normocephalic and atraumatic.  Eyes: Conjunctivae are normal.  Neck: Normal range of motion.  Cardiovascular: Normal rate, regular rhythm and intact distal pulses.  Capillary refill < 3 sec  Pulmonary/Chest: Effort normal and breath sounds normal.  Musculoskeletal: He exhibits tenderness. He exhibits no edema.  Left foot: Full range of motion of the ankle and all toes of the left  foot.  Small area of ecchymosis to the lateral portion of the plantar surface approximately over the shaft of the fifth metatarsal with tenderness to palpation.  No swelling or induration.  No erythema, increased warmth or fluctuance.  Neurological: He is alert. Coordination normal.  Sensation intact and normal touch in the left foot and ankle Strength 5/5 with dorsiflexion and plantarflexion of the left ankle, 5/5 with flexion and extension of the left great toe  Skin: Skin is warm and dry. He is not diaphoretic.  No tenting of the skin  Psychiatric: He has a normal mood and affect.  Nursing note and vitals reviewed.    ED Treatments / Results   Radiology Dg Foot Complete Left  Result Date: 08/02/2017 CLINICAL DATA:  31 y/o M; stepped on toy with lateral foot blister and with persistent pain. EXAM: LEFT FOOT - COMPLETE 3+ VIEW COMPARISON:  None. FINDINGS: There is no evidence of fracture or dislocation. There is no evidence of arthropathy or other focal bone abnormality. No bony erosive change. Lisfranc alignment is maintained. IMPRESSION: No acute bony or articular abnormality. Electronically Signed   By: Mitzi HansenLance  Furusawa-Stratton M.D.   On: 08/02/2017 23:21    Procedures Procedures (including critical care time)  Medications Ordered in ED Medications  acetaminophen (TYLENOL) tablet 1,000 mg (1,000 mg Oral Given 08/03/17 0044)     Initial Impression / Assessment and Plan / ED Course  I have reviewed the triage vital signs and the nursing notes.  Pertinent labs & imaging results that were available during my care of the patient were reviewed by me and considered in my medical decision making (see chart for details).     Patient X-Ray negative for obvious fracture or dislocation. No evidence of foreign body.  No open wound.  Pain managed in ED. Pt advised to follow up with PCP if symptoms persis. Patient given brace while in ED, conservative therapy recommended and discussed.  Patient will be dc home & is agreeable with above plan.   Final Clinical Impressions(s) / ED Diagnoses   Final diagnoses:  Contusion of left foot, initial encounter    ED Discharge Orders        Ordered    traMADol (ULTRAM) 50 MG tablet  Every 6 hours PRN     08/02/17 2330       Nester Bachus, Dahlia ClientHannah, PA-C 08/03/17 0351    Linwood DibblesKnapp, Jon, MD 08/03/17 1929

## 2017-08-02 NOTE — Discharge Instructions (Signed)
1. Medications: Take tylenol for pain control; Tramadol for severe pain only, usual home medications 2. Treatment: rest, ice, elevate, drink plenty of fluids, gentle stretching 3. Follow Up: Please followup with your PCP in 1 week if no improvement for discussion of your diagnoses and further evaluation after today's visit; if you do not have a primary care doctor use the resource guide provided to find one; Please return to the ER for worsening symptoms or other concerns

## 2017-08-02 NOTE — ED Triage Notes (Signed)
Pt states that yesterday he stepped on a toy that put a blister on his L foot and has had pain since, pt is ambulatory.

## 2017-11-10 DIAGNOSIS — F419 Anxiety disorder, unspecified: Secondary | ICD-10-CM | POA: Insufficient documentation

## 2018-12-21 ENCOUNTER — Other Ambulatory Visit: Payer: Self-pay

## 2018-12-21 ENCOUNTER — Encounter (HOSPITAL_COMMUNITY): Payer: Self-pay | Admitting: Emergency Medicine

## 2018-12-21 ENCOUNTER — Emergency Department (HOSPITAL_COMMUNITY)
Admission: EM | Admit: 2018-12-21 | Discharge: 2018-12-21 | Disposition: A | Discharge status: Home / Self Care | Payer: Managed Care, Other (non HMO) | Attending: Emergency Medicine | Admitting: Emergency Medicine

## 2018-12-21 ENCOUNTER — Emergency Department (HOSPITAL_COMMUNITY): Payer: Managed Care, Other (non HMO)

## 2018-12-21 DIAGNOSIS — Z79899 Other long term (current) drug therapy: Secondary | ICD-10-CM | POA: Insufficient documentation

## 2018-12-21 DIAGNOSIS — N200 Calculus of kidney: Secondary | ICD-10-CM | POA: Insufficient documentation

## 2018-12-21 DIAGNOSIS — R1031 Right lower quadrant pain: Secondary | ICD-10-CM | POA: Diagnosis present

## 2018-12-21 DIAGNOSIS — I10 Essential (primary) hypertension: Secondary | ICD-10-CM | POA: Insufficient documentation

## 2018-12-21 HISTORY — DX: Disorder of kidney and ureter, unspecified: N28.9

## 2018-12-21 LAB — COMPREHENSIVE METABOLIC PANEL
ALK PHOS: 86 U/L (ref 38–126)
ALT: 29 U/L (ref 0–44)
ANION GAP: 11 (ref 5–15)
AST: 21 U/L (ref 15–41)
Albumin: 4 g/dL (ref 3.5–5.0)
BILIRUBIN TOTAL: 1.4 mg/dL — AB (ref 0.3–1.2)
BUN: 12 mg/dL (ref 6–20)
CO2: 22 mmol/L (ref 22–32)
Calcium: 8.9 mg/dL (ref 8.9–10.3)
Chloride: 107 mmol/L (ref 98–111)
Creatinine, Ser: 1.86 mg/dL — ABNORMAL HIGH (ref 0.61–1.24)
GFR, EST AFRICAN AMERICAN: 54 mL/min — AB (ref >60)
GFR, EST NON AFRICAN AMERICAN: 46 mL/min — AB (ref >60)
Glucose, Bld: 122 mg/dL — ABNORMAL HIGH (ref 70–99)
Potassium: 3.8 mmol/L (ref 3.5–5.1)
Sodium: 140 mmol/L (ref 135–145)
TOTAL PROTEIN: 6.7 g/dL (ref 6.5–8.1)

## 2018-12-21 LAB — CBC WITH DIFFERENTIAL/PLATELET
Abs Immature Granulocytes: 0.03 10*3/uL (ref 0.00–0.07)
Basophils Absolute: 0.1 10*3/uL (ref 0.0–0.1)
Basophils Relative: 1 %
EOS PCT: 3 %
Eosinophils Absolute: 0.2 10*3/uL (ref 0.0–0.5)
HEMATOCRIT: 46.9 % (ref 39.0–52.0)
HEMOGLOBIN: 14.4 g/dL (ref 13.0–17.0)
Immature Granulocytes: 0 %
LYMPHS ABS: 3 10*3/uL (ref 0.7–4.0)
LYMPHS PCT: 45 %
MCH: 26 pg (ref 26.0–34.0)
MCHC: 30.7 g/dL (ref 30.0–36.0)
MCV: 84.7 fL (ref 80.0–100.0)
MONO ABS: 0.6 10*3/uL (ref 0.1–1.0)
Monocytes Relative: 8 %
Neutro Abs: 2.9 10*3/uL (ref 1.7–7.7)
Neutrophils Relative %: 43 %
Platelets: 286 10*3/uL (ref 150–400)
RBC: 5.54 MIL/uL (ref 4.22–5.81)
RDW: 13.6 % (ref 11.5–15.5)
WBC: 6.8 10*3/uL (ref 4.0–10.5)
nRBC: 0 % (ref 0.0–0.2)

## 2018-12-21 LAB — LIPASE, BLOOD: LIPASE: 30 U/L (ref 11–51)

## 2018-12-21 MED ORDER — HYDROMORPHONE HCL 1 MG/ML IJ SOLN
1.0000 mg | Freq: Once | INTRAMUSCULAR | Status: AC
Start: 2018-12-21 — End: 2018-12-21
  Administered 2018-12-21: 1 mg via INTRAVENOUS
  Filled 2018-12-21: qty 1

## 2018-12-21 MED ORDER — ONDANSETRON HCL 4 MG/2ML IJ SOLN
4.0000 mg | Freq: Once | INTRAMUSCULAR | Status: AC
  Administered 2018-12-21: 4 mg via INTRAVENOUS
  Filled 2018-12-21: qty 2

## 2018-12-21 MED ORDER — IBUPROFEN 400 MG PO TABS: 400.0000 mg | ORAL_TABLET | Freq: Two times a day (BID) | ORAL | 0 refills | Status: AC | PRN

## 2018-12-21 MED ORDER — LACTATED RINGERS IV BOLUS
1000.0000 mL | Freq: Once | INTRAVENOUS | Status: AC
  Administered 2018-12-21: 1000 mL via INTRAVENOUS

## 2018-12-21 MED ORDER — ONDANSETRON HCL 4 MG PO TABS: 4.0000 mg | ORAL_TABLET | Freq: Three times a day (TID) | ORAL | 0 refills | Status: DC | PRN

## 2018-12-21 MED ORDER — TAMSULOSIN HCL 0.4 MG PO CAPS
0.4000 mg | ORAL_CAPSULE | Freq: Once | ORAL | Status: AC
  Administered 2018-12-21: 0.4 mg via ORAL
  Filled 2018-12-21: qty 1

## 2018-12-21 MED ORDER — HYDROMORPHONE HCL 1 MG/ML IJ SOLN
1.0000 mg | Freq: Once | INTRAMUSCULAR | Status: AC
  Administered 2018-12-21: 1 mg via INTRAVENOUS
  Filled 2018-12-21: qty 1

## 2018-12-21 MED ORDER — ONDANSETRON 4 MG PO TBDP
4.0000 mg | ORAL_TABLET | Freq: Once | ORAL | Status: AC
  Administered 2018-12-21: 4 mg via ORAL
  Filled 2018-12-21: qty 1

## 2018-12-21 MED ORDER — OXYCODONE-ACETAMINOPHEN 5-325 MG PO TABS: 1.0000 | ORAL_TABLET | Freq: Four times a day (QID) | ORAL | 0 refills | Status: DC | PRN

## 2018-12-21 MED ORDER — KETOROLAC TROMETHAMINE 30 MG/ML IJ SOLN
15.0000 mg | Freq: Once | INTRAMUSCULAR | Status: AC
  Administered 2018-12-21: 15 mg via INTRAVENOUS
  Filled 2018-12-21: qty 1

## 2018-12-21 MED ORDER — OXYCODONE-ACETAMINOPHEN 5-325 MG PO TABS
1.0000 | ORAL_TABLET | Freq: Once | ORAL | Status: AC
  Administered 2018-12-21: 1 via ORAL
  Filled 2018-12-21: qty 1

## 2018-12-21 NOTE — ED Triage Notes (Signed)
Patient arrives POV c/o lower right groin pain radiating into his lower back onset of this morning. Patient states pain causing him to feel nauseated. Reports kidney disease.

## 2018-12-21 NOTE — ED Provider Notes (Signed)
Emergency Department Provider Note   I have reviewed the triage vital signs and the nursing notes.   HISTORY  Chief Complaint Flank Pain and Groin Pain   HPI Harold Young is a 33 y.o. male who presents emergency department today with right groin pain that has radiated to his flank and is now in his right CVA area.  Started about this morning.  Never had this before.  No history of kidney stones.  No urinary changes.  No fever.  Does have some nausea associated with the pain but no vomiting.  No rashes.  No trauma. No other associated or modifying symptoms.    Past Medical History:  Diagnosis Date  . Chronic back pain 2005   s/p injury at work  . Chronic headaches   . Hypertension   . Renal disorder   . Sciatica   . Seasonal allergic rhinitis     Patient Active Problem List   Diagnosis Date Noted  . Hypertension 01/10/2013    History reviewed. No pertinent surgical history.  Current Outpatient Rx  . Order #: [id number] Class: Print  . Order #: [id number] Class: Print  . Order #: [id number] Class: Historical Med  . Order #: [id number] Class: Print  . Order #: [id number] Class: Print  . Order #: [id number] Class: Print  . Order #: [id number] Class: Print  . Order #: [id number] Class: Historical Med    Allergies Patient has no known allergies.  Family History  Problem Relation Age of Onset  . Hypertension Mother   . Polycystic ovary syndrome Mother   . Diabetes Father   . Hypertension Father   . Bipolar disorder Sister   . Cancer Neg Hx   . Heart disease Neg Hx   . Stroke Neg Hx     Social History Social History   Tobacco Use  . Smoking status: Never Smoker  . Smokeless tobacco: Never Used  Substance Use Topics  . Alcohol use: No  . Drug use: No    Review of Systems  All other systems negative except as documented in the HPI. All pertinent positives and negatives as reviewed in the HPI. ____________________________________________   PHYSICAL EXAM:   VITAL SIGNS: Vitals:   12/21/18 0921 12/21/18 0923 12/21/18 1236  BP:  (!) 149/97 135/85  Pulse:  63 (!) 58  Resp:  18 16  Temp:  98 F (36.7 C)   TempSrc:  Oral   SpO2:  98% 99%  Weight: 108.9 kg    Height: 6\' 2"  (1.88 m)      Constitutional: Alert and oriented. Appears to be in distress 2/2 pain. Eyes: Conjunctivae are normal. PERRL. EOMI. Head: Atraumatic. Nose: No congestion/rhinnorhea. Mouth/Throat: Mucous membranes are moist.  Oropharynx non-erythematous. Neck: No stridor.  No meningeal signs.   Cardiovascular: Normal rate, regular rhythm. Good peripheral circulation. Grossly normal heart sounds.   Respiratory: Normal respiratory effort.  No retractions. Lungs CTAB. Gastrointestinal: Soft and nontender. No distention.  Musculoskeletal: No lower extremity tenderness nor edema. No gross deformities of extremities. Right CVA ttp. Neurologic:  Normal speech and language. No gross focal neurologic deficits are appreciated.  Skin:  Skin is warm, dry and intact. No rash noted.  ____________________________________________   LABS (all labs ordered are listed, but only abnormal results are displayed)  Labs Reviewed  COMPREHENSIVE METABOLIC PANEL - Abnormal; Notable for the following components:      Result Value   Glucose, Bld 122 (*)    Creatinine, Ser 1.86 (*)    Total Bilirubin 1.4 (*)  GFR calc non Af Amer 46 (*)    GFR calc Af Amer 54 (*)    All other components within normal limits  CBC WITH DIFFERENTIAL/PLATELET  LIPASE, BLOOD   ____________________________________________  EKG   EKG Interpretation  Date/Time:    Ventricular Rate:    PR Interval:    QRS Duration:   QT Interval:    QTC Calculation:   R Axis:     Text Interpretation:         ____________________________________________  RADIOLOGY  No results found.  ____________________________________________   PROCEDURES  Procedure(s) performed:   Procedures    ____________________________________________   INITIAL IMPRESSION / ASSESSMENT AND PLAN / ED COURSE  Symptoms consistent with ureterolithiasis.  Treated pain medications and Toradol with significant provement.  Does have some chronic kidney disease so we will go light on NSAIDs at home.  Otherwise stable for discharge home medications as prescribed.     Pertinent labs & imaging results that were available during my care of the patient were reviewed by me and considered in my medical decision making (see chart for details).  ____________________________________________  FINAL CLINICAL IMPRESSION(S) / ED DIAGNOSES  Final diagnoses:  Kidney stone     MEDICATIONS GIVEN DURING THIS VISIT:  Medications  HYDROmorphone (DILAUDID) injection 1 mg (1 mg Intravenous Given 12/21/18 0931)  ondansetron (ZOFRAN) injection 4 mg (4 mg Intravenous Given 12/21/18 0929)  lactated ringers bolus 1,000 mL (0 mLs Intravenous Stopped 12/21/18 1328)  HYDROmorphone (DILAUDID) injection 1 mg (1 mg Intravenous Given 12/21/18 1100)  ketorolac (TORADOL) 30 MG/ML injection 15 mg (15 mg Intravenous Given 12/21/18 1102)  tamsulosin (FLOMAX) capsule 0.4 mg (0.4 mg Oral Given 12/21/18 1103)  oxyCODONE-acetaminophen (PERCOCET/ROXICET) 5-325 MG per tablet 1 tablet (1 tablet Oral Given 12/21/18 1257)  ondansetron (ZOFRAN-ODT) disintegrating tablet 4 mg (4 mg Oral Given 12/21/18 1316)     NEW OUTPATIENT MEDICATIONS STARTED DURING THIS VISIT:  Discharge Medication List as of 12/21/2018  1:21 PM    START taking these medications   Details  ibuprofen (ADVIL,MOTRIN) 400 MG tablet Take 1 tablet (400 mg total) by mouth 2 (two) times daily as needed for up to 3 days., Starting Tue 12/21/2018, Until Fri 12/24/2018, Print    ondansetron (ZOFRAN) 4 MG tablet Take 1 tablet (4 mg total) by mouth every 8 (eight) hours as needed for nausea or vomiting., Starting Tue 12/21/2018, Print    oxyCODONE-acetaminophen (PERCOCET) 5-325 MG tablet  Take 1 tablet by mouth every 6 (six) hours as needed., Starting Tue 12/21/2018, Print        Note:  This note was prepared with assistance of Dragon voice recognition software. Occasional wrong-word or sound-a-like substitutions may have occurred due to the inherent limitations of voice recognition software.   Marily Memos, MD 12/24/18 [phone]

## 2018-12-21 NOTE — ED Notes (Signed)
Patient transported to CT 

## 2019-02-21 ENCOUNTER — Encounter: Payer: Self-pay | Admitting: Podiatry

## 2019-02-21 ENCOUNTER — Ambulatory Visit (INDEPENDENT_AMBULATORY_CARE_PROVIDER_SITE_OTHER): Payer: Managed Care, Other (non HMO) | Admitting: Podiatry

## 2019-02-21 ENCOUNTER — Other Ambulatory Visit: Payer: Self-pay | Admitting: Podiatry

## 2019-02-21 ENCOUNTER — Ambulatory Visit (INDEPENDENT_AMBULATORY_CARE_PROVIDER_SITE_OTHER): Payer: Managed Care, Other (non HMO)

## 2019-02-21 ENCOUNTER — Other Ambulatory Visit: Payer: Self-pay

## 2019-02-21 VITALS — Temp 98.1°F

## 2019-02-21 DIAGNOSIS — M216X2 Other acquired deformities of left foot: Secondary | ICD-10-CM

## 2019-02-21 DIAGNOSIS — M722 Plantar fascial fibromatosis: Secondary | ICD-10-CM | POA: Diagnosis not present

## 2019-02-21 DIAGNOSIS — M5432 Sciatica, left side: Secondary | ICD-10-CM

## 2019-02-21 DIAGNOSIS — M7750 Other enthesopathy of unspecified foot: Secondary | ICD-10-CM

## 2019-02-21 DIAGNOSIS — M216X1 Other acquired deformities of right foot: Secondary | ICD-10-CM

## 2019-02-21 MED ORDER — DICLOFENAC SODIUM 1 % TD GEL: 2.0000 g | Freq: Four times a day (QID) | TRANSDERMAL | 2 refills | Status: DC

## 2019-02-21 NOTE — Patient Instructions (Signed)

## 2019-02-24 ENCOUNTER — Other Ambulatory Visit: Payer: Self-pay | Admitting: Urgent Care

## 2019-02-24 ENCOUNTER — Other Ambulatory Visit: Payer: Self-pay

## 2019-02-24 ENCOUNTER — Ambulatory Visit
Admission: RE | Admit: 2019-02-24 | Discharge: 2019-02-24 | Disposition: A | Discharge status: Home / Self Care | Payer: Managed Care, Other (non HMO) | Source: Ambulatory Visit | Attending: Urgent Care | Admitting: Urgent Care

## 2019-02-24 DIAGNOSIS — M545 Low back pain, unspecified: Secondary | ICD-10-CM

## 2019-03-02 NOTE — Progress Notes (Signed)
Subjective:   Patient ID: Harold Young, male   DOB: 33 y.o.   MRN: [id number]   HPI 33 year old male presents the office today for concerns of pain to the heel.  He states his been thinking for the last several weeks and is worse in the day after being on his feet all day.  He does work in here [employer] and is on concrete floors all days.  He does get pain in the morning when he first gets up and is better with activity but also pain towards the end of the day.  He is also having back issues as well that have been ongoing prior to the feet hurting.  He thinks it may be walking differently causing some discomfort.  No numbness or tingling to the feet but he does relate a sharp radiating pain on the left leg and into his hip.  He has seen his primary care physician for the back as well as radiating pain.  He is interested in getting orthotics.   Review of Systems  All other systems reviewed and are negative.  Past Medical History:  Diagnosis Date  . Chronic back pain 2005   s/p injury at work  . Chronic headaches   . Hypertension   . Renal disorder   . Sciatica   . Seasonal allergic rhinitis     No past surgical history on file.   Current Outpatient Medications:  .  PRESCRIPTION MEDICATION, Anti-anxiety med, Disp: , Rfl:  .  cyclobenzaprine (FLEXERIL) 10 MG tablet, Take 1 tablet (10 mg total) by mouth 2 (two) times daily as needed for muscle spasms., Disp: 20 tablet, Rfl: 0 .  diclofenac sodium (VOLTAREN) 1 % GEL, Apply 2 g topically 4 (four) times daily. Rub into affected area of foot 2 to 4 times daily, Disp: 100 g, Rfl: 2 .  traMADol (ULTRAM) 50 MG tablet, Take 1 tablet (50 mg total) every 6 (six) hours as needed by mouth for moderate pain., Disp: 7 tablet, Rfl: 0 .  valsartan-hydrochlorothiazide (DIOVAN-HCT) 320-25 MG per tablet, Take 1 tablet by mouth daily., Disp: , Rfl:   No Known Allergies       Objective:  Physical Exam  General: AAO x3, NAD  Dermatological:  Skin is warm, dry and supple bilateral. Nails x 10 are well manicured; remaining integument appears unremarkable at this time. There are no open sores, no preulcerative lesions, no rash or signs of infection present.  Vascular: Dorsalis Pedis artery and Posterior Tibial artery pedal pulses are 2/4 bilateral with immedate capillary fill time. There is no pain with calf compression, swelling, warmth, erythema.   Neruologic: Grossly intact via light touch bilateral. Protective threshold with Semmes Wienstein monofilament intact to all pedal sites bilateral.  Negative Tinel sign bilaterally but he does describe sharp radiating pain from the hip to the left foot.  Musculoskeletal: Tenderness to palpation along the plantar medial tubercle of the calcaneus at the insertion of plantar fascia on the left and right foot. There is no pain along the course of the plantar fascia within the arch of the foot. Plantar fascia appears to be intact. There is no pain with lateral compression of the calcaneus or pain with vibratory sensation. There is no pain along the course or insertion of the achilles tendon. No other areas of tenderness to bilateral lower extremities.Muscular strength 5/5 in all groups tested bilateral.  Gait: Unassisted, Nonantalgic.       Assessment:   Bilateral heel pain, plantar fasciitis;  neuritis left leg with low back pain    Plan:  -Treatment options discussed including all alternatives, risks, and complications -Etiology of symptoms were discussed -X-rays were obtained and reviewed with the patient. There is no evidence of acute fracture or stress fracture identified today. -Discussed steroid injection but he wants to hold off. -Voltaren gel. -He wants to proceed with orthotics.  I had Liliane Channel evaluated him today he was measured for the orthotics. -Plan partial basis for now. -Discussed stretching, icing exercises daily. -I do want to continue to follow with his primary care physician  for the low back pain as well but seems to be no pain to his left leg.  Consider further evaluation if needed.  Trula Slade DPM

## 2019-03-21 ENCOUNTER — Other Ambulatory Visit: Payer: Managed Care, Other (non HMO) | Admitting: Orthotics

## 2019-03-21 ENCOUNTER — Ambulatory Visit: Payer: Managed Care, Other (non HMO) | Admitting: Podiatry

## 2019-03-22 ENCOUNTER — Other Ambulatory Visit: Payer: Self-pay | Admitting: Physician Assistant

## 2019-03-22 DIAGNOSIS — M5117 Intervertebral disc disorders with radiculopathy, lumbosacral region: Secondary | ICD-10-CM

## 2019-04-07 ENCOUNTER — Other Ambulatory Visit: Payer: Self-pay | Admitting: Urgent Care

## 2019-04-07 DIAGNOSIS — Z20822 Contact with and (suspected) exposure to covid-19: Secondary | ICD-10-CM

## 2019-04-12 LAB — NOVEL CORONAVIRUS, NAA: SARS-CoV-2, NAA: NOT DETECTED

## 2019-04-25 ENCOUNTER — Other Ambulatory Visit: Payer: Managed Care, Other (non HMO) | Admitting: Orthotics

## 2019-04-28 ENCOUNTER — Ambulatory Visit: Payer: Managed Care, Other (non HMO) | Admitting: Orthotics

## 2019-04-28 ENCOUNTER — Other Ambulatory Visit: Payer: Self-pay

## 2019-04-28 VITALS — Temp 97.1°F

## 2019-04-28 DIAGNOSIS — M722 Plantar fascial fibromatosis: Secondary | ICD-10-CM

## 2019-04-28 NOTE — Progress Notes (Signed)
Patient came in today to pick up custom made foot orthotics.  The goals were accomplished and the patient reported no dissatisfaction with said orthotics.  Patient was advised of breakin period and how to report any issues. 

## 2019-06-14 ENCOUNTER — Encounter (HOSPITAL_COMMUNITY): Payer: Self-pay | Admitting: Emergency Medicine

## 2019-06-14 ENCOUNTER — Other Ambulatory Visit: Payer: Self-pay

## 2019-06-14 ENCOUNTER — Emergency Department (HOSPITAL_COMMUNITY)
Admission: EM | Admit: 2019-06-14 | Discharge: 2019-06-15 | Disposition: A | Discharge status: Home / Self Care | Payer: Managed Care, Other (non HMO) | Attending: Emergency Medicine | Admitting: Emergency Medicine

## 2019-06-14 DIAGNOSIS — N189 Chronic kidney disease, unspecified: Secondary | ICD-10-CM | POA: Diagnosis not present

## 2019-06-14 DIAGNOSIS — I129 Hypertensive chronic kidney disease with stage 1 through stage 4 chronic kidney disease, or unspecified chronic kidney disease: Secondary | ICD-10-CM | POA: Diagnosis not present

## 2019-06-14 DIAGNOSIS — G43009 Migraine without aura, not intractable, without status migrainosus: Secondary | ICD-10-CM | POA: Insufficient documentation

## 2019-06-14 DIAGNOSIS — R197 Diarrhea, unspecified: Secondary | ICD-10-CM | POA: Diagnosis not present

## 2019-06-14 DIAGNOSIS — Z79899 Other long term (current) drug therapy: Secondary | ICD-10-CM | POA: Insufficient documentation

## 2019-06-14 DIAGNOSIS — R51 Headache: Secondary | ICD-10-CM | POA: Diagnosis present

## 2019-06-14 LAB — COMPREHENSIVE METABOLIC PANEL
ALT: 29 U/L (ref 0–44)
AST: 19 U/L (ref 15–41)
Albumin: 4 g/dL (ref 3.5–5.0)
Alkaline Phosphatase: 82 U/L (ref 38–126)
Anion gap: 11 (ref 5–15)
BUN: 13 mg/dL (ref 6–20)
CO2: 22 mmol/L (ref 22–32)
Calcium: 9.3 mg/dL (ref 8.9–10.3)
Chloride: 106 mmol/L (ref 98–111)
Creatinine, Ser: 1.83 mg/dL — ABNORMAL HIGH (ref 0.61–1.24)
GFR calc Af Amer: 55 mL/min — ABNORMAL LOW (ref >60)
GFR calc non Af Amer: 47 mL/min — ABNORMAL LOW (ref >60)
Glucose, Bld: 119 mg/dL — ABNORMAL HIGH (ref 70–99)
Potassium: 4 mmol/L (ref 3.5–5.1)
Sodium: 139 mmol/L (ref 135–145)
Total Bilirubin: 1 mg/dL (ref 0.3–1.2)
Total Protein: 7 g/dL (ref 6.5–8.1)

## 2019-06-14 LAB — CBC
HCT: 48.7 % (ref 39.0–52.0)
Hemoglobin: 16 g/dL (ref 13.0–17.0)
MCH: 27.4 pg (ref 26.0–34.0)
MCHC: 32.9 g/dL (ref 30.0–36.0)
MCV: 83.5 fL (ref 80.0–100.0)
Platelets: 282 10*3/uL (ref 150–400)
RBC: 5.83 MIL/uL — ABNORMAL HIGH (ref 4.22–5.81)
RDW: 13.7 % (ref 11.5–15.5)
WBC: 6.3 10*3/uL (ref 4.0–10.5)
nRBC: 0 % (ref 0.0–0.2)

## 2019-06-14 NOTE — ED Triage Notes (Signed)
Pt reports headache x1 week, states diarrhea began 3 days ago. Denies fevers, chills, vomiting.

## 2019-06-15 MED ORDER — LOPERAMIDE HCL 2 MG PO CAPS
4.0000 mg | ORAL_CAPSULE | Freq: Once | ORAL | Status: AC
  Administered 2019-06-15: 03:00:00 4 mg via ORAL
  Filled 2019-06-15: qty 2

## 2019-06-15 MED ORDER — DEXAMETHASONE SODIUM PHOSPHATE 10 MG/ML IJ SOLN
10.0000 mg | Freq: Once | INTRAMUSCULAR | Status: AC
  Administered 2019-06-15: 04:00:00 10 mg via INTRAVENOUS
  Filled 2019-06-15: qty 1

## 2019-06-15 MED ORDER — SODIUM CHLORIDE 0.9 % IV BOLUS (SEPSIS)
1000.0000 mL | Freq: Once | INTRAVENOUS | Status: AC
  Administered 2019-06-15: 1000 mL via INTRAVENOUS

## 2019-06-15 MED ORDER — SODIUM CHLORIDE 0.9 % IV BOLUS (SEPSIS)
1000.0000 mL | Freq: Once | INTRAVENOUS | Status: AC
  Administered 2019-06-15: 03:00:00 1000 mL via INTRAVENOUS

## 2019-06-15 MED ORDER — PROCHLORPERAZINE EDISYLATE 10 MG/2ML IJ SOLN
10.0000 mg | Freq: Once | INTRAMUSCULAR | Status: AC
  Administered 2019-06-15: 10 mg via INTRAVENOUS
  Filled 2019-06-15: qty 2

## 2019-06-15 NOTE — ED Provider Notes (Signed)
TIME SEEN: 3:03 AM  CHIEF COMPLAINT: Hypertension, chronic kidney disease  HPI: Patient is a 33 year old male with history of hypertension, chronic kidney disease, chronic headaches who presents the emergency department the posterior throbbing headache for the past week.  No photophobia, head injury, neck pain or neck stiffness, fevers, numbness, tingling or focal weakness, speech or vision changes.  Has had similar headaches in the past that have been treated with migraine cocktails.  Tried Tylenol at home without relief.  Also reports 3 days of diarrhea.  No blood in his stool or melena.  No abdominal pain, nausea or vomiting.  No sick contacts, recent travel, antibiotic use.  ROS: See HPI Constitutional: no fever  Eyes: no drainage  ENT: no runny nose   Cardiovascular:  no chest pain  Resp: no SOB  GI: no vomiting GU: no dysuria Integumentary: no rash  Allergy: no hives  Musculoskeletal: no leg swelling  Neurological: no slurred speech ROS otherwise negative  PAST MEDICAL HISTORY/PAST SURGICAL HISTORY:  Past Medical History:  Diagnosis Date  . Chronic back pain 2005   s/p injury at work  . Chronic headaches   . Hypertension   . Renal disorder   . Sciatica   . Seasonal allergic rhinitis     MEDICATIONS:  Prior to Admission medications   Medication Sig Start Date End Date Taking? Authorizing Provider  escitalopram (LEXAPRO) 20 MG tablet Take 20 mg by mouth daily. 03/24/19  Yes [provider]  hydrochlorothiazide (HYDRODIURIL) 12.5 MG tablet Take 12.5 mg by mouth daily. 05/25/19  Yes [provider]  lisinopril (ZESTRIL) 20 MG tablet Take 20 mg by mouth daily. 04/09/19  Yes [provider]  montelukast (SINGULAIR) 10 MG tablet Take 10 mg by mouth at bedtime. 05/25/19  Yes [provider]  cyclobenzaprine (FLEXERIL) 10 MG tablet Take 1 tablet (10 mg total) by mouth 2 (two) times daily as needed for muscle spasms. Patient not taking: Reported on  06/15/2019 09/24/15   Janne Napoleon, NP  diclofenac sodium (VOLTAREN) 1 % GEL Apply 2 g topically 4 (four) times daily. Rub into affected area of foot 2 to 4 times daily Patient not taking: Reported on 06/15/2019 02/21/19   Vivi Barrack, DPM  traMADol (ULTRAM) 50 MG tablet Take 1 tablet (50 mg total) every 6 (six) hours as needed by mouth for moderate pain. Patient not taking: Reported on 06/15/2019 08/02/17   Muthersbaugh, Dahlia Client, PA-C    ALLERGIES:  No Known Allergies  SOCIAL HISTORY:  Social History   Tobacco Use  . Smoking status: Never Smoker  . Smokeless tobacco: Never Used  Substance Use Topics  . Alcohol use: No    FAMILY HISTORY: Family History  Problem Relation Age of Onset  . Hypertension Mother   . Polycystic ovary syndrome Mother   . Diabetes Father   . Hypertension Father   . Bipolar disorder Sister   . Cancer Neg Hx   . Heart disease Neg Hx   . Stroke Neg Hx     EXAM: BP (!) 144/103 (BP Location: Right Arm)   Pulse 90   Temp 98 F (36.7 C) (Oral)   Resp 16   SpO2 100%  CONSTITUTIONAL: Alert and oriented and responds appropriately to questions. Well-appearing; well-nourished HEAD: Normocephalic EYES: Conjunctivae clear, pupils appear equal, EOMI ENT: normal nose; moist mucous membranes NECK: Supple, no meningismus, no nuchal rigidity, no LAD, no neck pain on palpation, no midline tenderness or step-off or deformity, full range of  motion in the neck CARD: RRR; S1 and S2 appreciated; no murmurs, no clicks, no rubs, no gallops RESP: Normal chest excursion without splinting or tachypnea; breath sounds clear and equal bilaterally; no wheezes, no rhonchi, no rales, no hypoxia or respiratory distress, speaking full sentences ABD/GI: Normal bowel sounds; non-distended; soft, non-tender, no rebound, no guarding, no peritoneal signs, no hepatosplenomegaly BACK:  The back appears normal and is non-tender to palpation, there is no CVA tenderness EXT: Normal ROM in  all joints; non-tender to palpation; no edema; normal capillary refill; no cyanosis, no calf tenderness or swelling    SKIN: Normal color for age and race; warm; no rash NEURO: Moves all extremities equally, reports normal sensation diffusely, normal speech, no facial asymmetry PSYCH: The patient's mood and manner are appropriate. Grooming and personal hygiene are appropriate.  MEDICAL DECISION MAKING: Patient here with posterior headache.  History of similar headaches in the past.  Doubt meningitis, encephalitis, stroke, intracranial hemorrhage.  States migraine cocktails have helped him previously.  He did drive himself to the emergency department and would like to drive home.  Will give IV fluids, Compazine and Decadron.  Patient's creatinine normally runs around 1.5 and today is 1.8.  Will give 2 L of IV fluids.  As for patient's diarrhea, likely viral in nature.  Doubt bacterial causes.  His abdominal exam is benign.  Doubt appendicitis, colitis, diverticulitis, perforation, obstruction.  Will give Imodium and reassess.  ED PROGRESS: 4:40 AM  Patient reports headache is completely gone after IV Compazine and fluids.  He has been able to eat and drink with no further diarrhea.  I feel he is safe to be discharged home.  He is comfortable with this plan.  Advised him to use Imodium over-the-counter as needed for diarrhea, Tylenol as needed for pain.  He knows to avoid NSAIDs given his history of chronic kidney disease.  Blood pressure has also improved now that his headache is completely resolved.   At this time, I do not feel there is any life-threatening condition present. I have reviewed and discussed all results (EKG, imaging, lab, urine as appropriate) and exam findings with patient/family. I have reviewed nursing notes and appropriate previous records.  I feel the patient is safe to be discharged home without further emergent workup and can continue workup as an outpatient as needed. Discussed  usual and customary return precautions. Patient/family verbalize understanding and are comfortable with this plan.  Outpatient follow-up has been provided as needed. All questions have been answered.     Ward, Delice Bison, DO 06/15/19 [phone]

## 2019-06-15 NOTE — Discharge Instructions (Addendum)
You may take Tylenol 1000 mg every 6 hours as needed for pain.  You may use Imodium over-the-counter as needed for diarrhea.  I recommend eating a bland diet and increasing your fluid intake.  Please avoid NSAIDs such as ibuprofen (also known as Motrin and Advil), aspirin, naproxen (also known as Aleve).

## 2019-06-15 NOTE — ED Notes (Signed)
Patient verbalizes understanding of discharge instructions. Opportunity for questioning and answers were provided. Armband removed by staff, pt discharged from ED ambulatory.   

## 2019-09-29 ENCOUNTER — Emergency Department (HOSPITAL_COMMUNITY): Payer: Managed Care, Other (non HMO)

## 2019-09-29 ENCOUNTER — Other Ambulatory Visit: Payer: Self-pay

## 2019-09-29 ENCOUNTER — Emergency Department (HOSPITAL_COMMUNITY)
Admission: EM | Admit: 2019-09-29 | Discharge: 2019-09-29 | Disposition: A | Discharge status: Home / Self Care | Payer: Managed Care, Other (non HMO) | Attending: Emergency Medicine | Admitting: Emergency Medicine

## 2019-09-29 ENCOUNTER — Encounter (HOSPITAL_COMMUNITY): Payer: Self-pay | Admitting: Emergency Medicine

## 2019-09-29 DIAGNOSIS — R0789 Other chest pain: Secondary | ICD-10-CM | POA: Insufficient documentation

## 2019-09-29 DIAGNOSIS — I10 Essential (primary) hypertension: Secondary | ICD-10-CM | POA: Insufficient documentation

## 2019-09-29 DIAGNOSIS — Z79899 Other long term (current) drug therapy: Secondary | ICD-10-CM | POA: Diagnosis not present

## 2019-09-29 LAB — TROPONIN I (HIGH SENSITIVITY)
Troponin I (High Sensitivity): 5 ng/L (ref <18)
Troponin I (High Sensitivity): 6 ng/L (ref <18)

## 2019-09-29 LAB — CBC
HCT: 44.8 % (ref 39.0–52.0)
Hemoglobin: 14 g/dL (ref 13.0–17.0)
MCH: 26.2 pg (ref 26.0–34.0)
MCHC: 31.3 g/dL (ref 30.0–36.0)
MCV: 83.9 fL (ref 80.0–100.0)
Platelets: 298 10*3/uL (ref 150–400)
RBC: 5.34 MIL/uL (ref 4.22–5.81)
RDW: 14.1 % (ref 11.5–15.5)
WBC: 5.9 10*3/uL (ref 4.0–10.5)
nRBC: 0 % (ref 0.0–0.2)

## 2019-09-29 LAB — BASIC METABOLIC PANEL
Anion gap: 10 (ref 5–15)
BUN: 16 mg/dL (ref 6–20)
CO2: 24 mmol/L (ref 22–32)
Calcium: 9 mg/dL (ref 8.9–10.3)
Chloride: 107 mmol/L (ref 98–111)
Creatinine, Ser: 1.67 mg/dL — ABNORMAL HIGH (ref 0.61–1.24)
GFR calc Af Amer: >60 mL/min (ref >60)
GFR calc non Af Amer: 52 mL/min — ABNORMAL LOW (ref >60)
Glucose, Bld: 105 mg/dL — ABNORMAL HIGH (ref 70–99)
Potassium: 3.8 mmol/L (ref 3.5–5.1)
Sodium: 141 mmol/L (ref 135–145)

## 2019-09-29 LAB — PROTIME-INR
INR: 1 (ref 0.8–1.2)
Prothrombin Time: 13.5 seconds (ref 11.4–15.2)

## 2019-09-29 MED ORDER — SODIUM CHLORIDE 0.9% FLUSH: 3.0000 mL | Freq: Once | INTRAVENOUS | Status: DC

## 2019-09-29 MED ORDER — DICLOFENAC SODIUM 1 % EX GEL: 2.0000 g | Freq: Four times a day (QID) | CUTANEOUS | 0 refills | Status: AC

## 2019-09-29 NOTE — Discharge Instructions (Addendum)
Use Voltaren gel as needed for pain.  You may also take Tylenol as needed. Nutrition well-hydrated water. Follow-up with your primary care doctor if your symptoms persist. Return to the emergency room if you develop fevers, severe worsening pain, difficulty breathing, or any new, sudden, or concerning symptoms.

## 2019-09-29 NOTE — ED Notes (Signed)
Pt ambulated around pod, did not complain of chest pain, heart rate stayed between 47-61 bpm.

## 2019-09-29 NOTE — ED Triage Notes (Signed)
Patient reports intermittent left chest pain onset last week with mild dizziness, denies SOB , no emesis or diaphoresis .

## 2019-09-29 NOTE — ED Provider Notes (Signed)
[facility] Provider Note   CSN: [id number] Arrival date & time: 09/29/19  0201     History Chief Complaint  Patient presents with  . Chest Pain    Harold Young is a 34 y.o. male presenting for evaluation of chest pain.  Patient states for the past week, he has been having intermittent left-sided chest pain.  Pain is of the left upper chest wall, worse with movement of his arm and exertion.  Patient states when pain comes on it feels like a pulling pain which is sharp and last for a few seconds before resolving.  He has minimal to no pain at rest.  He denies associated shortness of breath, nausea, vomiting, or diaphoresis.  Pain does not radiate.  He states he works in a [employer] and has a fairly physical job, although no change in his activity recently.  He denies fevers, chills, cough, abdominal pain, urinary symptoms, normal bowel movements.  He denies history of cardiac problems.  He denies family history of heart problems.  He denies tobacco, alcohol, drug use.  He has a history of hypertension and hyperlipidemia for which he takes medication.  Patient states he has also been having intermittent lightheadedness/dizziness.  Patient states he was not taking his blood pressure medicine for several days, restarted this past weekend, approximately 4 days ago.  Since then, he intermittently feels dizzy when he is standing.  No dizziness currently.  No chest pain currently.  He denies recent travel, surgeries, mobilization, history of cancer, history of previous PE/DVT, hormone use.  HPI     Past Medical History:  Diagnosis Date  . Chronic back pain 2005   s/p injury at work  . Chronic headaches   . Hypertension   . Renal disorder   . Sciatica   . Seasonal allergic rhinitis     Patient Active Problem List   Diagnosis Date Noted  . Anxiety 11/10/2017  . Hypertension 01/10/2013    History reviewed. No pertinent surgical  history.     Family History  Problem Relation Age of Onset  . Hypertension Mother   . Polycystic ovary syndrome Mother   . Diabetes Father   . Hypertension Father   . Bipolar disorder Sister   . Cancer Neg Hx   . Heart disease Neg Hx   . Stroke Neg Hx     Social History   Tobacco Use  . Smoking status: Never Smoker  . Smokeless tobacco: Never Used  Substance Use Topics  . Alcohol use: No  . Drug use: No    Home Medications Prior to Admission medications   Medication Sig Start Date End Date Taking? Authorizing Provider  cyclobenzaprine (FLEXERIL) 10 MG tablet Take 1 tablet (10 mg total) by mouth 2 (two) times daily as needed for muscle spasms. Patient not taking: Reported on 06/15/2019 09/24/15   Ashley Murrain, NP  diclofenac Sodium (VOLTAREN) 1 % GEL Apply 2 g topically 4 (four) times daily. 09/29/19   Jennafer Gladue, PA-C  escitalopram (LEXAPRO) 20 MG tablet Take 20 mg by mouth daily. 03/24/19   [provider]  hydrochlorothiazide (HYDRODIURIL) 12.5 MG tablet Take 12.5 mg by mouth daily. 05/25/19   [provider]  lisinopril (ZESTRIL) 20 MG tablet Take 20 mg by mouth daily. 04/09/19   [provider]  montelukast (SINGULAIR) 10 MG tablet Take 10 mg by mouth at bedtime. 05/25/19   [provider]  traMADol (ULTRAM) 50 MG tablet Take 1 tablet (  50 mg total) every 6 (six) hours as needed by mouth for moderate pain. Patient not taking: Reported on 06/15/2019 08/02/17   Muthersbaugh, Dahlia Client, PA-C    Allergies    Patient has no known allergies.  Review of Systems   Review of Systems  Cardiovascular: Positive for chest pain.  Neurological: Positive for dizziness.  All other systems reviewed and are negative.   Physical Exam Updated Vital Signs BP (!) 149/99 (BP Location: Right Arm)   Pulse 67   Temp 98.3 F (36.8 C) (Oral)   Resp 18   SpO2 100%   Physical Exam Vitals and nursing note reviewed.  Constitutional:      General: He is not  in acute distress.    Appearance: He is well-developed.     Comments: Sitting comfortably in the bed in no acute distress  HENT:     Head: Normocephalic and atraumatic.  Eyes:     Extraocular Movements: Extraocular movements intact.     Conjunctiva/sclera: Conjunctivae normal.     Pupils: Pupils are equal, round, and reactive to light.  Cardiovascular:     Rate and Rhythm: Normal rate and regular rhythm.     Pulses: Normal pulses.  Pulmonary:     Effort: Pulmonary effort is normal. No respiratory distress.     Breath sounds: Normal breath sounds. No wheezing.     Comments: Speaking in full sentences.  Clear lung sounds in all fields.  No wheezing, rales, rhonchi.  No tenderness palpation of the chest wall Chest:     Chest wall: No tenderness.    Abdominal:     General: There is no distension.     Palpations: Abdomen is soft. There is no mass.     Tenderness: There is no abdominal tenderness. There is no guarding or rebound.  Musculoskeletal:        General: Normal range of motion.     Cervical back: Normal range of motion and neck supple.     Right lower leg: No edema.     Left lower leg: No edema.  Skin:    General: Skin is warm and dry.     Capillary Refill: Capillary refill takes less than 2 seconds.  Neurological:     Mental Status: He is alert and oriented to person, place, and time.     ED Results / Procedures / Treatments   Labs (all labs ordered are listed, but only abnormal results are displayed) Labs Reviewed  BASIC METABOLIC PANEL - Abnormal; Notable for the following components:      Result Value   Glucose, Bld 105 (*)    Creatinine, Ser 1.67 (*)    GFR calc non Af Amer 52 (*)    All other components within normal limits  CBC  PROTIME-INR  TROPONIN I (HIGH SENSITIVITY)  TROPONIN I (HIGH SENSITIVITY)    EKG EKG Interpretation  Date/Time:  Thursday September 29 2019 02:08:50 EST Ventricular Rate:  74 PR Interval:  152 QRS Duration: 88 QT  Interval:  364 QTC Calculation: 404 R Axis:   13 Text Interpretation: Normal sinus rhythm Cannot rule out Anterior infarct , age undetermined Abnormal ECG Confirmed by Raeford Razor [phone]) on 09/29/2019 8:28:41 AM   Radiology DG Chest 2 View  Result Date: 09/29/2019 CLINICAL DATA:  34 year old male with chest pain. EXAM: CHEST - 2 VIEW COMPARISON:  None FINDINGS: The heart size and mediastinal contours are within normal limits. Both lungs are clear. The visualized skeletal structures are unremarkable. IMPRESSION:  No active cardiopulmonary disease. Electronically Signed   By: Elgie Collard M.D.   On: 09/29/2019 02:41    Procedures Procedures (including critical care time)  Medications Ordered in ED Medications  sodium chloride flush (NS) 0.9 % injection 3 mL (has no administration in time range)    ED Course  I have reviewed the triage vital signs and the nursing notes.  Pertinent labs & imaging results that were available during my care of the patient were reviewed by me and considered in my medical decision making (see chart for details).     MDM Rules/Calculators/A&P                      Patient presenting for evaluation of chest pain and intermittent dizziness.  Physical examination, he appears nontoxic.  Mild pain is not reproducible with palpation of the chest wall, it is reproducible with movement of his shoulder.  Distribution of pain is over the pec muscle.  Patient is low risk for cardiac event, and I have low suspicion for ACS at this time.  Labs obtained from triage are reassuring, delta troponin is negative x2.  EKG without STEMI.  Chest x-ray viewed interpreted by me, no pneumonia, postinfusion, cardiomegaly.  Will obtain orthostatic vital signs and heart rate while ambulating to ensure no abnormalities.  Patient is PERC negative, doubt PE.  Consider MSK cause for pain.  Consider restarting blood pressure medicine as cause for intermittent dizziness.  Encouraged hydration  and close follow-up with primary care.  Orthostatics negative.  Patient ambulated without chest pain, difficulty breathing, dizziness, or abnormality in the heart rate.  Discussed symptomatic treatment with voltaren gel.  At this time, patient appears safe for discharge.  Return precautions given.  Patient states he understand and agrees to plan.  Final Clinical Impression(s) / ED Diagnoses Final diagnoses:  Atypical chest pain  Chest wall pain    Rx / DC Orders ED Discharge Orders         Ordered    diclofenac Sodium (VOLTAREN) 1 % GEL  4 times daily     09/29/19 0901           Alveria Apley, PA-C 09/29/19 0254    Raeford Razor, MD 09/30/19 1012

## 2019-09-29 NOTE — ED Notes (Signed)
C/o left anterior chest pain sharp in nature, states the pain comes and goes. States he was at work last night and noticed when he was walking around the pain  Would increase. States it is sharp on nature

## 2019-10-10 ENCOUNTER — Ambulatory Visit: Payer: Managed Care, Other (non HMO) | Attending: Internal Medicine

## 2019-10-10 DIAGNOSIS — Z20822 Contact with and (suspected) exposure to covid-19: Secondary | ICD-10-CM

## 2019-10-12 LAB — NOVEL CORONAVIRUS, NAA: SARS-CoV-2, NAA: NOT DETECTED

## 2019-12-21 ENCOUNTER — Other Ambulatory Visit: Payer: Self-pay | Admitting: Nephrology

## 2019-12-21 DIAGNOSIS — Q612 Polycystic kidney, adult type: Secondary | ICD-10-CM

## 2019-12-27 ENCOUNTER — Ambulatory Visit
Admission: RE | Admit: 2019-12-27 | Discharge: 2019-12-27 | Disposition: A | Discharge status: Home / Self Care | Payer: Managed Care, Other (non HMO) | Source: Ambulatory Visit | Attending: Nephrology | Admitting: Nephrology

## 2019-12-27 DIAGNOSIS — Q612 Polycystic kidney, adult type: Secondary | ICD-10-CM

## 2020-07-05 ENCOUNTER — Other Ambulatory Visit: Payer: Self-pay

## 2020-07-05 ENCOUNTER — Emergency Department (HOSPITAL_COMMUNITY)
Admission: EM | Admit: 2020-07-05 | Discharge: 2020-07-06 | Disposition: A | Discharge status: Home / Self Care | Payer: Managed Care, Other (non HMO) | Attending: Emergency Medicine | Admitting: Emergency Medicine

## 2020-07-05 ENCOUNTER — Encounter (HOSPITAL_COMMUNITY): Payer: Self-pay | Admitting: Emergency Medicine

## 2020-07-05 DIAGNOSIS — S39011A Strain of muscle, fascia and tendon of abdomen, initial encounter: Secondary | ICD-10-CM | POA: Diagnosis not present

## 2020-07-05 DIAGNOSIS — N200 Calculus of kidney: Secondary | ICD-10-CM | POA: Insufficient documentation

## 2020-07-05 DIAGNOSIS — R109 Unspecified abdominal pain: Secondary | ICD-10-CM

## 2020-07-05 DIAGNOSIS — Z79899 Other long term (current) drug therapy: Secondary | ICD-10-CM | POA: Diagnosis not present

## 2020-07-05 DIAGNOSIS — I10 Essential (primary) hypertension: Secondary | ICD-10-CM | POA: Insufficient documentation

## 2020-07-05 DIAGNOSIS — X501XXA Overexertion from prolonged static or awkward postures, initial encounter: Secondary | ICD-10-CM | POA: Insufficient documentation

## 2020-07-05 DIAGNOSIS — T148XXA Other injury of unspecified body region, initial encounter: Secondary | ICD-10-CM

## 2020-07-05 LAB — CBC WITH DIFFERENTIAL/PLATELET
Abs Immature Granulocytes: 0.02 10*3/uL (ref 0.00–0.07)
Basophils Absolute: 0.1 10*3/uL (ref 0.0–0.1)
Basophils Relative: 1 %
Eosinophils Absolute: 0.2 10*3/uL (ref 0.0–0.5)
Eosinophils Relative: 3 %
HCT: 44.6 % (ref 39.0–52.0)
Hemoglobin: 13.6 g/dL (ref 13.0–17.0)
Immature Granulocytes: 0 %
Lymphocytes Relative: 37 %
Lymphs Abs: 2.2 10*3/uL (ref 0.7–4.0)
MCH: 25.8 pg — ABNORMAL LOW (ref 26.0–34.0)
MCHC: 30.5 g/dL (ref 30.0–36.0)
MCV: 84.6 fL (ref 80.0–100.0)
Monocytes Absolute: 0.4 10*3/uL (ref 0.1–1.0)
Monocytes Relative: 6 %
Neutro Abs: 3.2 10*3/uL (ref 1.7–7.7)
Neutrophils Relative %: 53 %
Platelets: 276 10*3/uL (ref 150–400)
RBC: 5.27 MIL/uL (ref 4.22–5.81)
RDW: 13.9 % (ref 11.5–15.5)
WBC: 6.1 10*3/uL (ref 4.0–10.5)
nRBC: 0 % (ref 0.0–0.2)

## 2020-07-05 LAB — URINALYSIS, ROUTINE W REFLEX MICROSCOPIC
Bilirubin Urine: NEGATIVE
Glucose, UA: NEGATIVE mg/dL
Hgb urine dipstick: NEGATIVE
Ketones, ur: NEGATIVE mg/dL
Leukocytes,Ua: NEGATIVE
Nitrite: NEGATIVE
Protein, ur: NEGATIVE mg/dL
Specific Gravity, Urine: 1.013 (ref 1.005–1.030)
pH: 5 (ref 5.0–8.0)

## 2020-07-05 NOTE — ED Triage Notes (Signed)
Pt c/o flank pain all day today, denies any urinary symptoms

## 2020-07-06 ENCOUNTER — Emergency Department (HOSPITAL_COMMUNITY): Payer: Managed Care, Other (non HMO)

## 2020-07-06 LAB — BASIC METABOLIC PANEL
Anion gap: 12 (ref 5–15)
BUN: 15 mg/dL (ref 6–20)
CO2: 21 mmol/L — ABNORMAL LOW (ref 22–32)
Calcium: 9.4 mg/dL (ref 8.9–10.3)
Chloride: 108 mmol/L (ref 98–111)
Creatinine, Ser: 1.92 mg/dL — ABNORMAL HIGH (ref 0.61–1.24)
GFR, Estimated: 44 mL/min — ABNORMAL LOW (ref >60)
Glucose, Bld: 118 mg/dL — ABNORMAL HIGH (ref 70–99)
Potassium: 3.7 mmol/L (ref 3.5–5.1)
Sodium: 141 mmol/L (ref 135–145)

## 2020-07-06 MED ORDER — LIDOCAINE 4 % EX PTCH: 1.0000 | MEDICATED_PATCH | CUTANEOUS | 15 refills | Status: DC

## 2020-07-06 MED ORDER — CYCLOBENZAPRINE HCL 10 MG PO TABS: 10.0000 mg | ORAL_TABLET | Freq: Two times a day (BID) | ORAL | 0 refills | Status: DC | PRN

## 2020-07-06 MED ORDER — LIDOCAINE 5 % EX PTCH
1.0000 | MEDICATED_PATCH | CUTANEOUS | Status: DC
  Administered 2020-07-06: 1 via TRANSDERMAL
  Filled 2020-07-06: qty 1

## 2020-07-06 MED ORDER — ACETAMINOPHEN 325 MG PO TABS
650.0000 mg | ORAL_TABLET | Freq: Once | ORAL | Status: AC
  Administered 2020-07-06: 650 mg via ORAL
  Filled 2020-07-06: qty 2

## 2020-07-06 NOTE — ED Notes (Signed)
Patient transported to CT 

## 2020-07-06 NOTE — ED Notes (Signed)
Pt aao4, gcs15, reporting right sided flank pain, believes possibly pulled a muscle or possible kidney stone. VSS on monitor, denies trauma, denies chets pain or sob. Sinus on ccm, pt ambulates with steady gait. NADN. Side rails up, call bell in reach.

## 2020-07-06 NOTE — ED Provider Notes (Signed)
[facility] EMERGENCY DEPARTMENT Provider Note   CSN: [id number] Arrival date & time: 07/05/20  2248     History Chief Complaint  Patient presents with  . Flank Pain    Harold Young is a 34 y.o. male with a history of polycystic kidney disease, nephrolithiasis, HTN who presents to the emergency department with a chief complaint of right flank pain.  The patient reports that he works at a distribution center and was bent over a wooden pallet earlier tonight to pick up a box when he developed sudden onset right flank pain. Pain is sharp and worse with movement, especially twisting of the torso. Pain is still present when he is not moving, but is much more dull. He reports that he is very concerned about his kidneys given his history of polycystic kidney disease, which prompted his ER visit. No history of similar pain. No other recent falls or injuries. No fever, chills, dysuria, hematuria, testicular pain, abdominal pain, nausea, vomiting, diarrhea, constipation, cough, shortness of breath, URI symptoms. No treatment prior to arrival.  He reports that his last creatinine function was 1.9-2.0 when it was checked in the outpatient setting.  He is fully vaccinated against COVID-19.  The history is provided by the patient and medical records. No language interpreter was used.       Past Medical History:  Diagnosis Date  . Chronic back pain 2005   s/p injury at work  . Chronic headaches   . Hypertension   . Renal disorder   . Sciatica   . Seasonal allergic rhinitis     Patient Active Problem List   Diagnosis Date Noted  . Anxiety 11/10/2017  . Hypertension 01/10/2013    History reviewed. No pertinent surgical history.     Family History  Problem Relation Age of Onset  . Hypertension Mother   . Polycystic ovary syndrome Mother   . Diabetes Father   . Hypertension Father   . Bipolar disorder Sister   . Cancer Neg Hx   . Heart disease Neg Hx   . Stroke  Neg Hx     Social History   Tobacco Use  . Smoking status: Never Smoker  . Smokeless tobacco: Never Used  Substance Use Topics  . Alcohol use: No  . Drug use: No    Home Medications Prior to Admission medications   Medication Sig Start Date End Date Taking? Authorizing Provider  cyclobenzaprine (FLEXERIL) 10 MG tablet Take 1 tablet (10 mg total) by mouth 2 (two) times daily as needed for muscle spasms. 07/06/20   Lucyann Romano A, PA-C  diclofenac Sodium (VOLTAREN) 1 % GEL Apply 2 g topically 4 (four) times daily. 09/29/19   Caccavale, Sophia, PA-C  escitalopram (LEXAPRO) 20 MG tablet Take 20 mg by mouth daily. 03/24/19   [provider]  hydrochlorothiazide (HYDRODIURIL) 12.5 MG tablet Take 12.5 mg by mouth daily. 05/25/19   [provider]  Lidocaine (HM LIDOCAINE PATCH) 4 % PTCH Apply 1 patch topically daily. 07/06/20   Jamontae Thwaites A, PA-C  lisinopril (ZESTRIL) 20 MG tablet Take 20 mg by mouth daily. 04/09/19   [provider]  montelukast (SINGULAIR) 10 MG tablet Take 10 mg by mouth at bedtime. 05/25/19   [provider]    Allergies    Patient has no known allergies.  Review of Systems   Review of Systems  Constitutional: Negative for appetite change, chills, fatigue and fever.  Respiratory: Negative for shortness of breath.  Cardiovascular: Negative for chest pain.  Gastrointestinal: Negative for abdominal pain, diarrhea, nausea and vomiting.  Endocrine: Negative for polyuria.  Genitourinary: Positive for flank pain. Negative for dysuria, hematuria, penile pain, penile swelling, scrotal swelling and urgency.  Musculoskeletal: Negative for back pain.  Skin: Negative for rash.  Allergic/Immunologic: Negative for immunocompromised state.  Neurological: Negative for seizures, syncope, weakness, numbness and headaches.  Psychiatric/Behavioral: Negative for confusion.    Physical Exam Updated Vital Signs BP (!) 146/103 (BP Location: Right Arm)    Pulse (!) 56   Temp 98 F (36.7 C) (Oral)   Resp 16   SpO2 99%   Physical Exam Vitals and nursing note reviewed.  Constitutional:      General: He is not in acute distress.    Appearance: He is well-developed. He is not ill-appearing, toxic-appearing or diaphoretic.  HENT:     Head: Normocephalic.  Eyes:     Conjunctiva/sclera: Conjunctivae normal.  Cardiovascular:     Rate and Rhythm: Normal rate and regular rhythm.     Pulses: Normal pulses.     Heart sounds: Normal heart sounds. No murmur heard.  No friction rub. No gallop.   Pulmonary:     Effort: Pulmonary effort is normal. No respiratory distress.     Breath sounds: No stridor. No wheezing, rhonchi or rales.  Chest:     Chest wall: No tenderness.  Abdominal:     General: There is no distension.     Palpations: Abdomen is soft. There is no mass.     Tenderness: There is abdominal tenderness. There is no right CVA tenderness, left CVA tenderness, guarding or rebound.     Hernia: No hernia is present.     Comments: Reproducible tenderness palpation to the right latissimus dorsi.  No focal tenderness palpation to the right inferior ribs.  No crepitus or step-offs.  Abdomen is soft, nontender, nondistended.  Negative Murphy sign.  No CVA tenderness bilaterally.  No overlying rashes, erythema, edema, or warmth.  Pain is reproducible with twisting of the torso.  Musculoskeletal:        General: No tenderness.     Cervical back: Neck supple.     Right lower leg: No edema.     Left lower leg: No edema.  Skin:    General: Skin is warm and dry.     Capillary Refill: Capillary refill takes less than 2 seconds.  Neurological:     Mental Status: He is alert.  Psychiatric:        Behavior: Behavior normal.     ED Results / Procedures / Treatments   Labs (all labs ordered are listed, but only abnormal results are displayed) Labs Reviewed  CBC WITH DIFFERENTIAL/PLATELET - Abnormal; Notable for the following components:       Result Value   MCH 25.8 (*)    All other components within normal limits  BASIC METABOLIC PANEL - Abnormal; Notable for the following components:   CO2 21 (*)    Glucose, Bld 118 (*)    Creatinine, Ser 1.92 (*)    GFR, Estimated 44 (*)    All other components within normal limits  URINALYSIS, ROUTINE W REFLEX MICROSCOPIC - Abnormal; Notable for the following components:   Color, Urine STRAW (*)    All other components within normal limits    EKG None  Radiology CT Renal Stone Study  Result Date: 07/06/2020 CLINICAL DATA:  Flank pain EXAM: CT ABDOMEN AND PELVIS WITHOUT CONTRAST TECHNIQUE: Multidetector CT imaging  of the abdomen and pelvis was performed following the standard protocol without IV contrast. COMPARISON:  12/21/2018 FINDINGS: LOWER CHEST: Normal. HEPATOBILIARY: Normal hepatic contours. No intra- or extrahepatic biliary dilatation. Normal gallbladder. PANCREAS: Normal pancreas. No ductal dilatation or peripancreatic fluid collection. SPLEEN: Normal. ADRENALS/URINARY TRACT: The adrenal glands are normal. Multiple bilateral renal cysts with renal enlargement. Multiple hyperdense foci are unchanged and likely hemorrhagic or proteinaceous cysts. 6 mm nonobstructing right renal calculus. The urinary bladder is normal for degree of distention STOMACH/BOWEL: There is no hiatal hernia. Normal duodenal course and caliber. No small bowel dilatation or inflammation. No focal colonic abnormality. Normal appendix. VASCULAR/LYMPHATIC: Normal course and caliber of the major abdominal vessels. No abdominal or pelvic lymphadenopathy. REPRODUCTIVE: Normal prostate size with symmetric seminal vesicles. MUSCULOSKELETAL. No bony spinal canal stenosis or focal osseous abnormality. OTHER: None. IMPRESSION: 1. No acute abnormality of the abdomen or pelvis. 2. Unchanged appearance of polycystic kidney disease. 3. Unchanged 6 mm nonobstructing right renal calculus. Electronically Signed   By: Deatra RobinsonKevin  Herman M.D.    On: 07/06/2020 02:53    Procedures Procedures (including critical care time)  Medications Ordered in ED Medications  lidocaine (LIDODERM) 5 % 1 patch (1 patch Transdermal Patch Applied 07/06/20 0235)  acetaminophen (TYLENOL) tablet 650 mg (650 mg Oral Given 07/06/20 0235)    ED Course  I have reviewed the triage vital signs and the nursing notes.  Pertinent labs & imaging results that were available during my care of the patient were reviewed by me and considered in my medical decision making (see chart for details).  Clinical Course as of Jul 06 336  Fri Jul 06, 2020  0217 Oral temp 98.51 performed by me at bedside. Patient has had no infectious symptoms. Unsure if documented temperature of 100.7 earlier was falsely elevated, but I suspect it is.   [MM]    Clinical Course User Index [MM] Saber Dickerman, Coral ElseMia A, PA-C   MDM Rules/Calculators/A&P                          34 year old male with a history of polycystic kidney disease, nephrolithiasis, HTN presenting with sudden onset right flank pain earlier tonight while he was at work after he attempted to lift a box while he was bent over a wooden pallet.  Vital signs are normal.  He did have 1 documented temperature of 100.7 orally.  However, he denies any infectious symptoms.  I personally rechecked his temperature at bedside and it was 98.5, which was prior to having Tylenol.  Suspect this is falsely elevated.  He has no leukocytosis.  Urinalysis is unremarkable.  He has a history of polycystic kidney disease and his most recent creatinine was 1.9-2.0.  Creatinine is stable today at 1.92.  He has no metabolic derangements.  Labs are otherwise reassuring.  Previously, patient did have a kidney stone without evidence of hemoglobinuria in his UA.  Although I strongly suspect this is a musculoskeletal strain, given mechanism of injury and physical exam, and a shared decision-making conversation with the patient at bedside.  He would like to  have a renal stone study performed, which has been ordered.   CT stone study With no acute abnormality of the abdomen and pelvis.  Unchanged appearance of polycystic kidney disease.  There is an unchanged 6 mm nonobstructing right renal calculus.  These findings were discussed with patient.  He previously given given a lidocaine patch and Tylenol and reevaluation, reports improvement in  his symptoms.  Will discharge home with the same and Flexeril.  Encouraged RICE therapy for suspected musculoskeletal strain.  I have a low suspicion for cholecystitis, choledocholithiasis, pulmonary infarct, PE, pneumonia, or intra-abdominal infection.  All questions answered.  He is hemodynamically stable no acute distress.  Safe for discharge home with outpatient follow-up as indicated.  Final Clinical Impression(s) / ED Diagnoses Final diagnoses:  Musculoskeletal strain  Right flank pain  Right nephrolithiasis    Rx / DC Orders ED Discharge Orders         Ordered    cyclobenzaprine (FLEXERIL) 10 MG tablet  2 times daily PRN        07/06/20 0323    Lidocaine (HM LIDOCAINE PATCH) 4 % PTCH  Every 24 hours        07/06/20 0323           Barkley Boards, PA-C 07/06/20 2440    Gilda Crease, MD 07/06/20 [phone]

## 2020-07-06 NOTE — Discharge Instructions (Signed)
Thank you for allowing me to care for you today in the Emergency Department.   Your work-up was reassuring.  Please keep your follow-up appointment next month with your outpatient team.  Your kidney function (creatinine level) was stable at 1.90.  Your CAT scan showed that you have a cyst in your right kidney, but this should not be causing your pain since it is not in the ureter.  Take 650 mg of Tylenol once every 6 hours for pain.  You can apply 1 lidocaine patch to areas that are painful once every 24 hours.  Apply an ice pack for 15 to 20 minutes 3-5 times a day for the next 5 days.  After 72 hours, you can also try heating pads to see if this will help with your symptoms.  Gently stretch as your pain allows.  Please know, sometimes these symptoms can take up to 6 weeks to fully heal.  Return to the emergency department if you develop high fevers with worsening pain, a rash over the area, uncontrollable vomiting, if you stop producing urine, or develop other new, concerning symptoms.

## 2020-11-13 ENCOUNTER — Other Ambulatory Visit: Payer: Self-pay | Admitting: Nephrology

## 2020-11-13 DIAGNOSIS — Q612 Polycystic kidney, adult type: Secondary | ICD-10-CM

## 2020-11-22 ENCOUNTER — Ambulatory Visit
Admission: RE | Admit: 2020-11-22 | Discharge: 2020-11-22 | Disposition: A | Discharge status: Home / Self Care | Payer: Managed Care, Other (non HMO) | Source: Ambulatory Visit | Attending: Nephrology | Admitting: Nephrology

## 2020-11-22 DIAGNOSIS — Q612 Polycystic kidney, adult type: Secondary | ICD-10-CM

## 2021-01-03 IMAGING — CR LUMBAR SPINE - COMPLETE 4+ VIEW
5 series · 5 of 5 positions shown · non-contrast
Comparison: CT 12/07/2018

CLINICAL DATA: Chronic low back pain

EXAM:
LUMBAR SPINE - COMPLETE 4+ VIEW

[t lumbar spine ap]
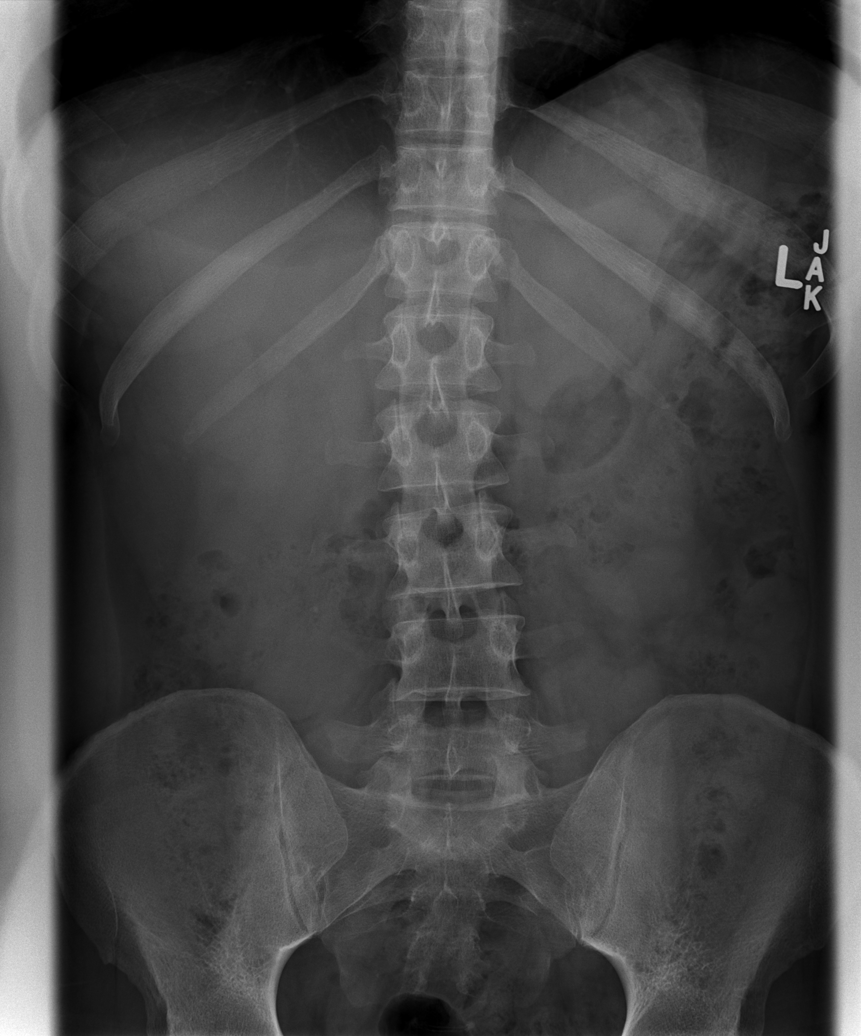

[t lumbar spine obl (1 of 2)]
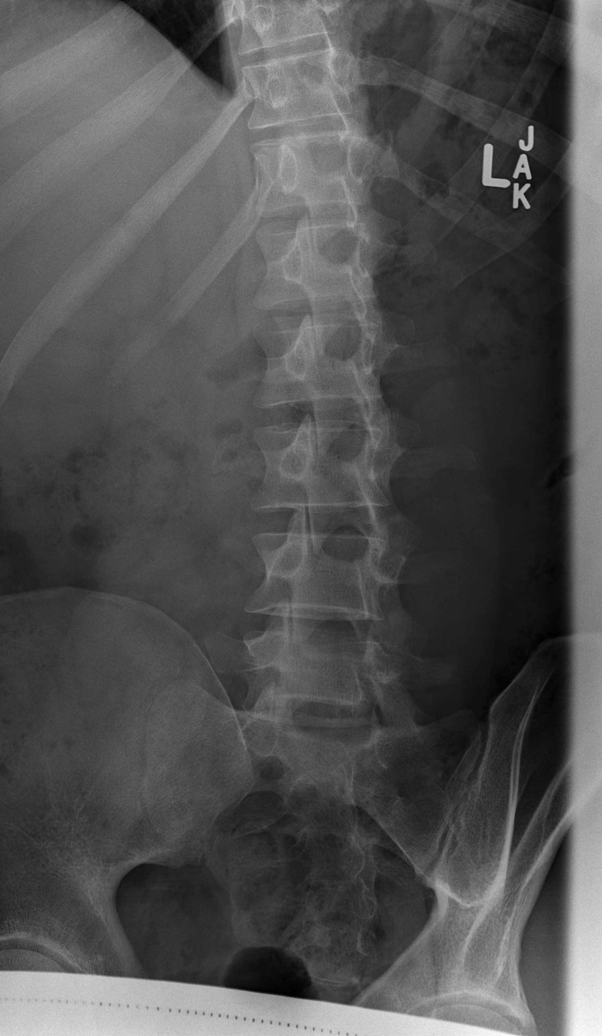

[t lumbar spine obl (2 of 2)]
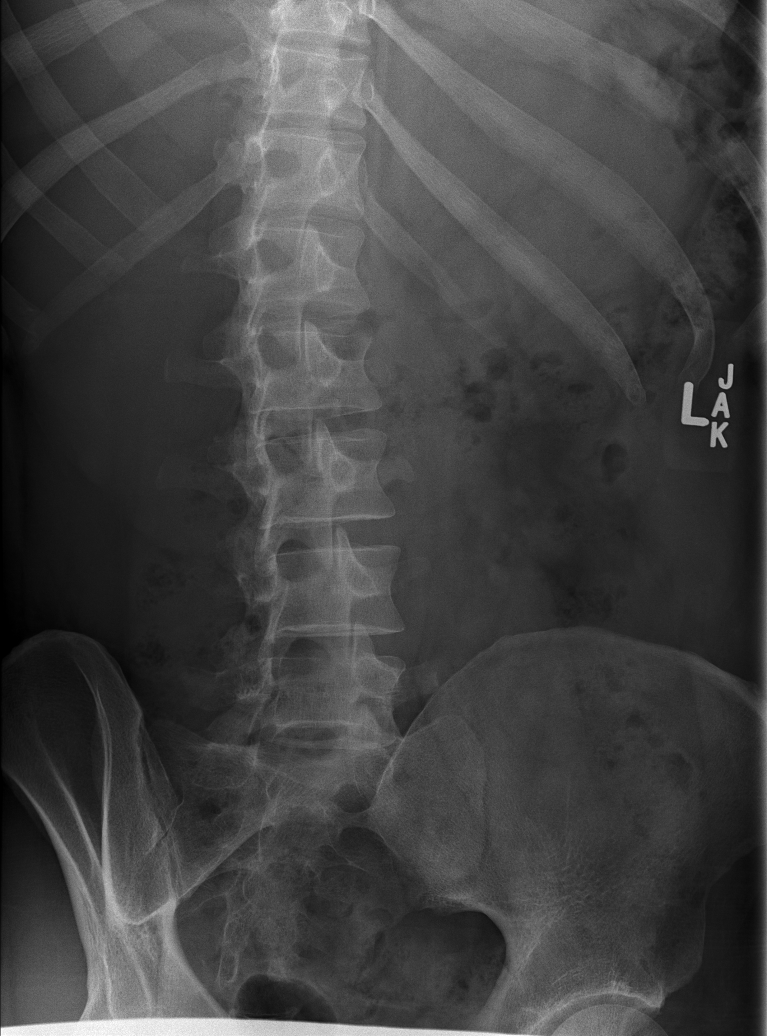

[t lumbar spine lat]
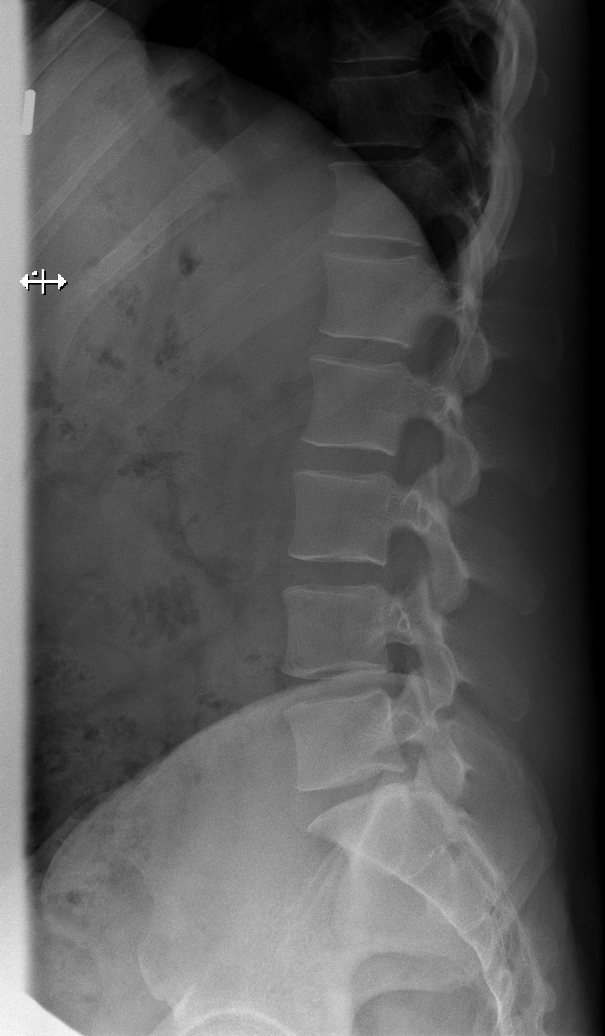

[t lumbar l-5 s-1 spot]
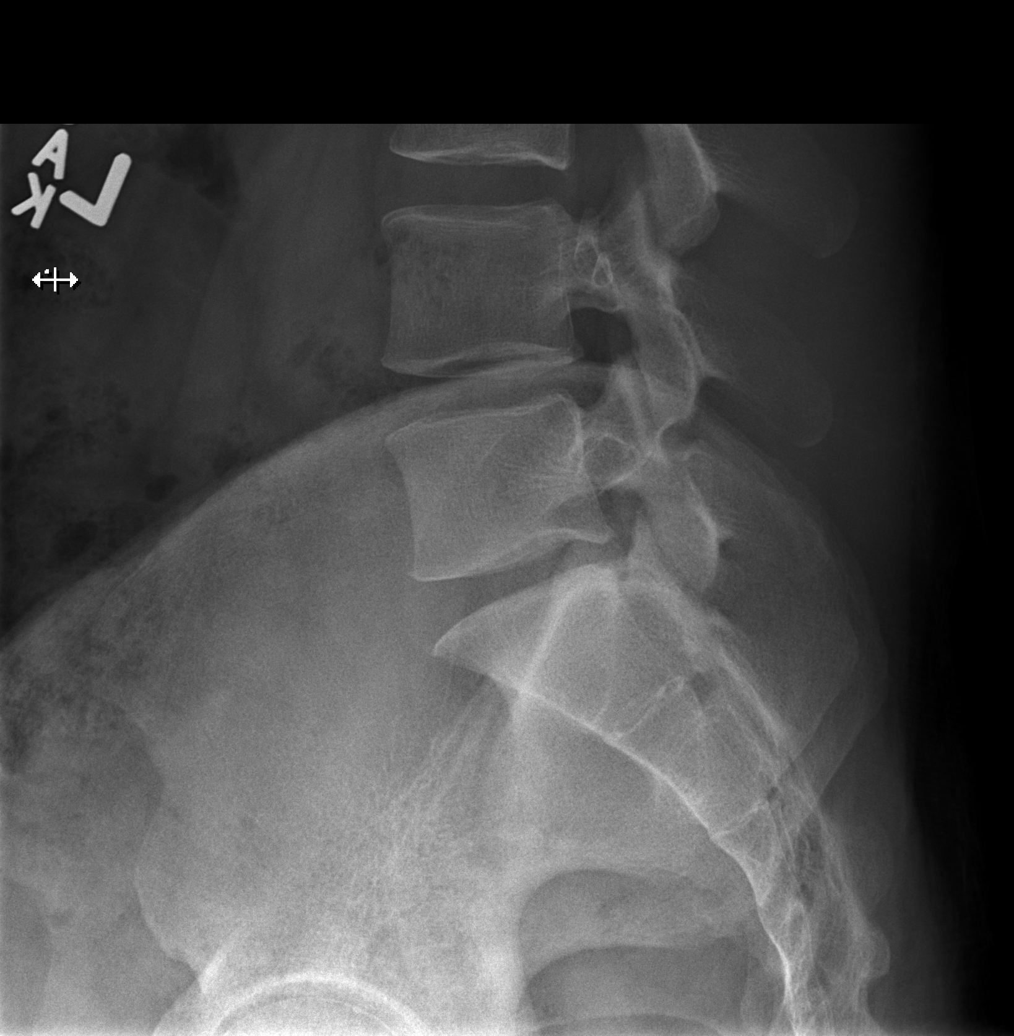

[5 of 5 positions shown; findings below may reference images not displayed]

FINDINGS: There is no evidence of lumbar spine fracture. Alignment is normal.
Intervertebral disc spaces are maintained.
IMPRESSION: Negative.

## 2021-06-09 ENCOUNTER — Emergency Department (HOSPITAL_BASED_OUTPATIENT_CLINIC_OR_DEPARTMENT_OTHER)
Admission: EM | Admit: 2021-06-09 | Discharge: 2021-06-09 | Disposition: A | Discharge status: Home / Self Care | Payer: Managed Care, Other (non HMO) | Attending: Emergency Medicine | Admitting: Emergency Medicine

## 2021-06-09 ENCOUNTER — Other Ambulatory Visit: Payer: Self-pay

## 2021-06-09 ENCOUNTER — Encounter (HOSPITAL_BASED_OUTPATIENT_CLINIC_OR_DEPARTMENT_OTHER): Payer: Self-pay | Admitting: Emergency Medicine

## 2021-06-09 DIAGNOSIS — R059 Cough, unspecified: Secondary | ICD-10-CM | POA: Diagnosis present

## 2021-06-09 DIAGNOSIS — Z79899 Other long term (current) drug therapy: Secondary | ICD-10-CM | POA: Insufficient documentation

## 2021-06-09 DIAGNOSIS — J069 Acute upper respiratory infection, unspecified: Secondary | ICD-10-CM | POA: Insufficient documentation

## 2021-06-09 DIAGNOSIS — J3489 Other specified disorders of nose and nasal sinuses: Secondary | ICD-10-CM | POA: Insufficient documentation

## 2021-06-09 DIAGNOSIS — G4489 Other headache syndrome: Secondary | ICD-10-CM | POA: Insufficient documentation

## 2021-06-09 DIAGNOSIS — Z20822 Contact with and (suspected) exposure to covid-19: Secondary | ICD-10-CM | POA: Diagnosis not present

## 2021-06-09 DIAGNOSIS — I10 Essential (primary) hypertension: Secondary | ICD-10-CM | POA: Insufficient documentation

## 2021-06-09 MED ORDER — ACETAMINOPHEN 500 MG PO TABS
1000.0000 mg | ORAL_TABLET | Freq: Once | ORAL | Status: AC
  Administered 2021-06-09: 1000 mg via ORAL
  Filled 2021-06-09: qty 2

## 2021-06-09 MED ORDER — PROCHLORPERAZINE EDISYLATE 10 MG/2ML IJ SOLN
10.0000 mg | Freq: Once | INTRAMUSCULAR | Status: AC
  Administered 2021-06-09: 10 mg via INTRAMUSCULAR
  Filled 2021-06-09: qty 2

## 2021-06-09 NOTE — Discharge Instructions (Addendum)
You can take Coricidin HBP for cough and congestion.

## 2021-06-09 NOTE — ED Triage Notes (Addendum)
  Patient comes in with cough and headache that started on Friday.  Patient states he had nasal congestion, chills, headache, and has been taking OTC sudafed.  Last dose of sudafed at 1830 last night.  Patient states he woke up earlier this morning nauseous and headache was worse.  No vomiting.  No fevers known at home.  Pain 7/10, throbbing headache.  Vaccinated.  No sick contacts that hes aware of.

## 2021-06-09 NOTE — ED Provider Notes (Signed)
[facility] EMERGENCY DEPT Provider Note  CSN: [id number] Arrival date & time: 06/09/21 0542  Chief Complaint(s) Cough and Headache  HPI Harold Young is a 35 y.o. male here with several days of nasal congestion,  cough, chills with left-sided throbbing headache.  No known fevers.  Patient reports that his nasal congestion and cough have improved after taking Sudafed.  Reports that he has been sleeping most of the day.  Woke up tonight around 3 AM to eat and felt nauseated.  He denies any emesis.  Reports that his headache is now worse.  Headache is throbbing in the left temporal region.  No nuchal rigidity.  No visual disturbance.  No focal deficits.  Patient reports that his son is just getting over a viral infection which began 1 week ago.  His son tested negative for COVID-19.  Reports prior history of chronic headaches which resolved after he has been taking blood pressure medicine.  The history is provided by the patient.   Past Medical History Past Medical History:  Diagnosis Date   Chronic back pain 2005   s/p injury at work   Chronic headaches    Hypertension    Renal disorder    Sciatica    Seasonal allergic rhinitis    Patient Active Problem List   Diagnosis Date Noted   Anxiety 11/10/2017   Hypertension 01/10/2013   Home Medication(s) Prior to Admission medications   Medication Sig Start Date End Date Taking? Authorizing Provider  cyclobenzaprine (FLEXERIL) 10 MG tablet Take 1 tablet (10 mg total) by mouth 2 (two) times daily as needed for muscle spasms. 07/06/20   McDonald, Mia A, PA-C  diclofenac Sodium (VOLTAREN) 1 % GEL Apply 2 g topically 4 (four) times daily. 09/29/19   Caccavale, Sophia, PA-C  escitalopram (LEXAPRO) 20 MG tablet Take 20 mg by mouth daily. 03/24/19   [provider]  hydrochlorothiazide (HYDRODIURIL) 12.5 MG tablet Take 12.5 mg by mouth daily. 05/25/19   [provider]  Lidocaine (HM LIDOCAINE PATCH) 4 % PTCH Apply  1 patch topically daily. 07/06/20   McDonald, Mia A, PA-C  lisinopril (ZESTRIL) 20 MG tablet Take 20 mg by mouth daily. 04/09/19   [provider]  montelukast (SINGULAIR) 10 MG tablet Take 10 mg by mouth at bedtime. 05/25/19   [provider]                                                                                                                                    Past Surgical History History reviewed. No pertinent surgical history. Family History Family History  Problem Relation Age of Onset   Hypertension Mother    Polycystic ovary syndrome Mother    Diabetes Father    Hypertension Father    Bipolar disorder Sister    Cancer Neg Hx    Heart disease Neg Hx    Stroke Neg Hx     Social  History Social History   Tobacco Use   Smoking status: Never   Smokeless tobacco: Never  Substance Use Topics   Alcohol use: No   Drug use: No   Allergies Patient has no known allergies.  Review of Systems Review of Systems All other systems are reviewed and are negative for acute change except as noted in the HPI  Physical Exam Vital Signs  I have reviewed the triage vital signs BP (!) 153/97 (BP Location: Right Arm)   Pulse 63   Temp 98 F (36.7 C) (Oral)   Resp 18   Ht 6\' 3"  (1.905 m)   Wt 108.9 kg   SpO2 100%   BMI 30.00 kg/m   Physical Exam Vitals reviewed.  Constitutional:      General: He is not in acute distress.    Appearance: He is well-developed. He is not diaphoretic.  HENT:     Head: Normocephalic and atraumatic.     Right Ear: Tympanic membrane normal.     Left Ear: Tympanic membrane normal.     Nose: Congestion and rhinorrhea present.     Mouth/Throat:     Pharynx: No posterior oropharyngeal erythema.     Tonsils: No tonsillar exudate or tonsillar abscesses.     Comments: Post nasal drip  Eyes:     General: No scleral icterus.       Right eye: No discharge.        Left eye: No discharge.     Conjunctiva/sclera: Conjunctivae  normal.     Pupils: Pupils are equal, round, and reactive to light.  Cardiovascular:     Rate and Rhythm: Normal rate and regular rhythm.     Heart sounds: No murmur heard.   No friction rub. No gallop.  Pulmonary:     Effort: Pulmonary effort is normal. No respiratory distress.     Breath sounds: Normal breath sounds. No stridor. No wheezing, rhonchi or rales.  Abdominal:     General: There is no distension.     Palpations: Abdomen is soft.     Tenderness: There is no abdominal tenderness.  Musculoskeletal:        General: No tenderness.     Cervical back: Normal range of motion and neck supple.  Skin:    General: Skin is warm and dry.     Findings: No erythema or rash.  Neurological:     Mental Status: He is alert and oriented to person, place, and time.     Comments: Mental Status:  Alert and oriented to person, place, and time.  Attention and concentration normal.  Speech clear.  Recent memory is intact  Cranial Nerves:  II Visual Fields: Intact to confrontation. Visual fields intact. III, IV, VI: Pupils equal and reactive to light and near. Full eye movement without nystagmus  V Facial Sensation: Normal. No weakness of masticatory muscles  VII: No facial weakness or asymmetry  VIII Auditory Acuity: Grossly normal  IX/X: The uvula is midline; the palate elevates symmetrically  XI: Normal sternocleidomastoid and trapezius strength  XII: The tongue is midline. No atrophy or fasciculations.   Motor System: Muscle Strength: 5/5 and symmetric in the upper and lower extremities. No pronation or drift.  Muscle Tone: Tone and muscle bulk are normal in the upper and lower extremities.  Coordination: Intact finger-to-nose. No tremor.  Sensation: Intact to light touch.  Gait: Routine gait normal.     ED Results and Treatments Labs (all labs ordered are listed, but only  abnormal results are displayed) Labs Reviewed  SARS CORONAVIRUS 2 (TAT 6-24 HRS)                                                                                                                          EKG  EKG Interpretation  Date/Time:    Ventricular Rate:    PR Interval:    QRS Duration:   QT Interval:    QTC Calculation:   R Axis:     Text Interpretation:         Radiology No results found.  Pertinent labs & imaging results that were available during my care of the patient were reviewed by me and considered in my medical decision making (see MDM for details).  Medications Ordered in ED Medications  prochlorperazine (COMPAZINE) injection 10 mg (10 mg Intramuscular Given 06/09/21 0619)  acetaminophen (TYLENOL) tablet 1,000 mg (1,000 mg Oral Given 06/09/21 4332)                                                                                                                                     Procedures Procedures  (including critical care time)  Medical Decision Making / ED Course I have reviewed the nursing notes for this encounter and the patient's prior records (if available in EHR or on provided paperwork).  Treyon Wymore was evaluated in Emergency Department on 06/09/2021 for the symptoms described in the history of present illness. He was evaluated in the context of the global COVID-19 pandemic, which necessitated consideration that the patient might be at risk for infection with the SARS-CoV-2 virus that causes COVID-19. Institutional protocols and algorithms that pertain to the evaluation of patients at risk for COVID-19 are in a state of rapid change based on information released by regulatory bodies including the CDC and federal and state organizations. These policies and algorithms were followed during the patient's care in the ED.     Patient presents with viral symptoms for 3-4 days. adequate oral hydration. Rest of history as above.  Patient appears well. No signs of toxicity, patient is interactive. No hypoxia, tachypnea or other signs of respiratory distress. No sign  of clinical dehydration. Lung exam clear. Rest of exam as above.  Most consistent with viral illness   No evidence suggestive of pharyngitis, AOM, PNA, or meningitis.  Chest x-ray not indicated at this time.  COVID/Flu ordered. Pending.  Treated with  IM compazine and oral tylenol for headache. Pain improved.   Discussed symptomatic treatment with the patient and they will follow closely with their PCP.    Final Clinical Impression(s) / ED Diagnoses Final diagnoses:  Viral URI with cough  Other headache syndrome   The patient appears reasonably screened and/or stabilized for discharge and I doubt any other medical condition or other [facility] requiring further screening, evaluation, or treatment in the ED at this time prior to discharge. Safe for discharge with strict return precautions.  Disposition: Discharge  Condition: Good  I have discussed the results, Dx and Tx plan with the patient/family who expressed understanding and agree(s) with the plan. Discharge instructions discussed at length. The patient/family was given strict return precautions who verbalized understanding of the instructions. No further questions at time of discharge.    ED Discharge Orders     None      Follow Up: Beverly Sessions [address] [place] [address] [phone]  Call  as needed, if symptoms do not improve or  worsen     This chart was dictated using voice recognition software.  Despite best efforts to proofread,  errors can occur which can change the documentation meaning.    Nira Conn, MD 06/09/21 [phone]

## 2021-06-10 LAB — SARS CORONAVIRUS 2 (TAT 6-24 HRS): SARS Coronavirus 2: NEGATIVE

## 2021-06-24 ENCOUNTER — Other Ambulatory Visit: Payer: Self-pay | Admitting: Nephrology

## 2021-06-24 DIAGNOSIS — Q612 Polycystic kidney, adult type: Secondary | ICD-10-CM

## 2021-06-25 ENCOUNTER — Other Ambulatory Visit: Payer: Managed Care, Other (non HMO)

## 2021-06-26 ENCOUNTER — Ambulatory Visit
Admission: RE | Admit: 2021-06-26 | Discharge: 2021-06-26 | Disposition: A | Discharge status: Home / Self Care | Payer: Managed Care, Other (non HMO) | Source: Ambulatory Visit | Attending: Nephrology | Admitting: Nephrology

## 2021-06-26 DIAGNOSIS — Q612 Polycystic kidney, adult type: Secondary | ICD-10-CM

## 2021-08-12 ENCOUNTER — Ambulatory Visit (INDEPENDENT_AMBULATORY_CARE_PROVIDER_SITE_OTHER): Payer: Managed Care, Other (non HMO) | Admitting: Podiatry

## 2021-08-12 ENCOUNTER — Ambulatory Visit: Payer: Managed Care, Other (non HMO) | Admitting: Podiatry

## 2021-08-12 ENCOUNTER — Other Ambulatory Visit: Payer: Self-pay

## 2021-08-12 DIAGNOSIS — M79674 Pain in right toe(s): Secondary | ICD-10-CM | POA: Diagnosis not present

## 2021-08-12 DIAGNOSIS — L600 Ingrowing nail: Secondary | ICD-10-CM

## 2021-08-18 NOTE — Progress Notes (Signed)
Subjective: 35 year old male presents the office today with his wife for concerns of ingrown toenail on the right fifth medial nail border.  Area is tender.  He tries to trim them himself but it comes back causing discomfort.  Currently denies any drainage or pus.  No other concerns.  Objective: AAO x3, NAD DP/PT pulses palpable bilaterally, CRT less than 3 seconds Incurvation present along the right fifth medial nail border there is also a spicule of nail that is present which seems to be attached on the base of the nail, medial aspect.  There is no edema, erythema, drainage or pus or any signs of infection.  Tenderness palpation to the nail corner.  No pain with calf compression, swelling, warmth, erythema  Assessment: Ingrown toenail right medial fifth toe  Plan: -All treatment options discussed with the patient including all alternatives, risks, complications.  -At this time, the patient is requesting partial nail removal with chemical matricectomy to the symptomatic portion of the nail. Risks and complications were discussed with the patient for which they understand and written consent was obtained. Under sterile conditions a total of 3 mL of a mixture of 2% lidocaine plain and 0.5% Marcaine plain was infiltrated in a digital block fashion. Once anesthetized, the skin was prepped in sterile fashion. A tourniquet was then applied. Next the medial aspect of fifth nail border was then sharply excised making sure to remove the entire offending nail border. Once the nails were ensured to be removed area was debrided and the underlying skin was intact. There is no purulence identified in the procedure. Next phenol was then applied under standard conditions and copiously irrigated. Silvadene was applied. A dry sterile dressing was applied. After application of the dressing the tourniquet was removed and there is found to be an immediate capillary refill time to the digit. The patient tolerated the  procedure well any complications. Post procedure instructions were discussed the patient for which he verbally understood. Follow-up in one week for nail check or sooner if any problems are to arise. Discussed signs/symptoms of infection and directed to call the office immediately should any occur or go directly to the emergency room. In the meantime, encouraged to call the office with any questions, concerns, changes symptoms. -Patient encouraged to call the office with any questions, concerns, change in symptoms.   Vivi Barrack DPM

## 2021-08-30 ENCOUNTER — Ambulatory Visit (INDEPENDENT_AMBULATORY_CARE_PROVIDER_SITE_OTHER): Payer: Managed Care, Other (non HMO) | Admitting: Podiatry

## 2021-08-30 ENCOUNTER — Other Ambulatory Visit: Payer: Self-pay

## 2021-08-30 DIAGNOSIS — L600 Ingrowing nail: Secondary | ICD-10-CM

## 2021-09-02 NOTE — Progress Notes (Signed)
Subjective: 35 year old male presents the office today for follow evaluation after undergoing right medial fifth digit partial nail avulsion.  He states that he is doing better.  Denies any drainage or pus.  He has been soaking in Epson salts.   Objective: AAO x3, NAD DP/PT pulses palpable bilaterally, CRT less than 3 seconds Status post partial nail avulsion of the right fifth digit medial nail border.  Appears to be healed only small mount of scabbing is present.  There is no edema, erythema or drainage or pus or any signs of infection. No pain with calf compression, swelling, warmth, erythema  Assessment: Status post right fifth digit partial nail avulsion  Plan: -All treatment options discussed with the patient including all alternatives, risks, complications.  -Procedure sites healing well.  Continue Epson salt soaks right wrist wash with soap and water daily and apply antibiotic ointment and a bandage during the day but can leave the area open at nighttime.  Monitor for any reoccurrence or signs or symptoms of infection. -Patient encouraged to call the office with any questions, concerns, change in symptoms.   Vivi Barrack DPM

## 2022-06-10 ENCOUNTER — Other Ambulatory Visit: Payer: Self-pay | Admitting: Nephrology

## 2022-06-10 DIAGNOSIS — Q612 Polycystic kidney, adult type: Secondary | ICD-10-CM

## 2022-06-12 ENCOUNTER — Other Ambulatory Visit: Payer: Managed Care, Other (non HMO)

## 2022-06-12 ENCOUNTER — Ambulatory Visit
Admission: RE | Admit: 2022-06-12 | Discharge: 2022-06-12 | Disposition: A | Discharge status: Home / Self Care | Payer: Managed Care, Other (non HMO) | Source: Ambulatory Visit | Attending: Nephrology | Admitting: Nephrology

## 2022-06-12 DIAGNOSIS — Q612 Polycystic kidney, adult type: Secondary | ICD-10-CM

## 2022-07-19 ENCOUNTER — Emergency Department (HOSPITAL_BASED_OUTPATIENT_CLINIC_OR_DEPARTMENT_OTHER): Payer: Managed Care, Other (non HMO)

## 2022-07-19 ENCOUNTER — Encounter (HOSPITAL_BASED_OUTPATIENT_CLINIC_OR_DEPARTMENT_OTHER): Payer: Self-pay

## 2022-07-19 ENCOUNTER — Emergency Department (HOSPITAL_BASED_OUTPATIENT_CLINIC_OR_DEPARTMENT_OTHER)
Admission: EM | Admit: 2022-07-19 | Discharge: 2022-07-19 | Disposition: A | Discharge status: Home / Self Care | Payer: Managed Care, Other (non HMO) | Attending: Emergency Medicine | Admitting: Emergency Medicine

## 2022-07-19 ENCOUNTER — Other Ambulatory Visit: Payer: Self-pay

## 2022-07-19 DIAGNOSIS — R1012 Left upper quadrant pain: Secondary | ICD-10-CM | POA: Insufficient documentation

## 2022-07-19 DIAGNOSIS — R1011 Right upper quadrant pain: Secondary | ICD-10-CM | POA: Insufficient documentation

## 2022-07-19 DIAGNOSIS — Z79899 Other long term (current) drug therapy: Secondary | ICD-10-CM | POA: Diagnosis not present

## 2022-07-19 DIAGNOSIS — I10 Essential (primary) hypertension: Secondary | ICD-10-CM | POA: Insufficient documentation

## 2022-07-19 LAB — URINALYSIS, ROUTINE W REFLEX MICROSCOPIC
Bilirubin Urine: NEGATIVE
Glucose, UA: NEGATIVE mg/dL
Hgb urine dipstick: NEGATIVE
Ketones, ur: NEGATIVE mg/dL
Leukocytes,Ua: NEGATIVE
Nitrite: NEGATIVE
Protein, ur: NEGATIVE mg/dL
Specific Gravity, Urine: 1.006 (ref 1.005–1.030)
pH: 5.5 (ref 5.0–8.0)

## 2022-07-19 LAB — COMPREHENSIVE METABOLIC PANEL
ALT: 45 U/L — ABNORMAL HIGH (ref 0–44)
AST: 21 U/L (ref 15–41)
Albumin: 4.4 g/dL (ref 3.5–5.0)
Alkaline Phosphatase: 73 U/L (ref 38–126)
Anion gap: 9 (ref 5–15)
BUN: 11 mg/dL (ref 6–20)
CO2: 26 mmol/L (ref 22–32)
Calcium: 9.3 mg/dL (ref 8.9–10.3)
Chloride: 107 mmol/L (ref 98–111)
Creatinine, Ser: 1.96 mg/dL — ABNORMAL HIGH (ref 0.61–1.24)
GFR, Estimated: 44 mL/min — ABNORMAL LOW (ref >60)
Glucose, Bld: 149 mg/dL — ABNORMAL HIGH (ref 70–99)
Potassium: 3.5 mmol/L (ref 3.5–5.1)
Sodium: 142 mmol/L (ref 135–145)
Total Bilirubin: 1.2 mg/dL (ref 0.3–1.2)
Total Protein: 7.6 g/dL (ref 6.5–8.1)

## 2022-07-19 LAB — CBC
HCT: 44.1 % (ref 39.0–52.0)
Hemoglobin: 14.3 g/dL (ref 13.0–17.0)
MCH: 27.5 pg (ref 26.0–34.0)
MCHC: 32.4 g/dL (ref 30.0–36.0)
MCV: 84.8 fL (ref 80.0–100.0)
Platelets: 287 10*3/uL (ref 150–400)
RBC: 5.2 MIL/uL (ref 4.22–5.81)
RDW: 14.6 % (ref 11.5–15.5)
WBC: 6.1 10*3/uL (ref 4.0–10.5)
nRBC: 0 % (ref 0.0–0.2)

## 2022-07-19 LAB — LIPASE, BLOOD: Lipase: 37 U/L (ref 11–51)

## 2022-07-19 MED ORDER — ACETAMINOPHEN 325 MG PO TABS
ORAL_TABLET | ORAL | Status: AC
  Administered 2022-07-19: 650 mg via ORAL
  Filled 2022-07-19: qty 1

## 2022-07-19 MED ORDER — ACETAMINOPHEN 325 MG PO TABS
650.0000 mg | ORAL_TABLET | Freq: Once | ORAL | Status: AC
  Filled 2022-07-19: qty 2

## 2022-07-19 NOTE — ED Triage Notes (Signed)
Patient here POV from Home.  Endorses Right Sided Lateral Flank/ABD Pain that began last PM that has slightly worsened since.  No Dysuria. No N/V. No New Diarrhea.   NAD Noted during Triage. A&Ox4. GCS 15. Ambulatory.

## 2022-07-19 NOTE — Discharge Instructions (Addendum)
Your labs and imaging were reassuring today. Please take tylenol for pain. I recommend close follow-up with your PCP/nephrology for reevaluation.  Please do not hesitate to return to emergency department if worrisome signs symptoms we discussed become apparent.

## 2022-07-19 NOTE — ED Provider Notes (Signed)
[facility] EMERGENCY DEPT Provider Note   CSN: [id number] Arrival date & time: 07/19/22  1431     History {Add pertinent medical, surgical, social history, OB history to HPI:1} Chief Complaint  Patient presents with  . Flank Pain    Harold Young is a 36 y.o. male has medical history of polycystic kidney disease, hypertension presenting to the emergency department for evaluation of abdominal pain.  Patient states he has had right upper quadrant abdominal pain since yesterday.  The pain is sharp, constant, nonradiating worse with changing position.  Patient has not tried anything for pain.  Patient has had similar pain in the past where he was diagnosed with gallbladder stone.  Endorses diarrhea.  Denies dark stool or dark urine.  Denies nausea, vomiting, chest pain, shortness of breath, constipation, rash, urinary symptoms.   Flank Pain Associated symptoms include abdominal pain.       Home Medications Prior to Admission medications   Medication Sig Start Date End Date Taking? Authorizing Provider  cyclobenzaprine (FLEXERIL) 10 MG tablet Take 1 tablet (10 mg total) by mouth 2 (two) times daily as needed for muscle spasms. 07/06/20   McDonald, Mia A, PA-C  diclofenac Sodium (VOLTAREN) 1 % GEL Apply 2 g topically 4 (four) times daily. 09/29/19   Caccavale, Sophia, PA-C  escitalopram (LEXAPRO) 20 MG tablet Take 20 mg by mouth daily. 03/24/19   [provider]  hydrochlorothiazide (HYDRODIURIL) 12.5 MG tablet Take 12.5 mg by mouth daily. 05/25/19   [provider]  Lidocaine (HM LIDOCAINE PATCH) 4 % PTCH Apply 1 patch topically daily. 07/06/20   McDonald, Mia A, PA-C  lisinopril (ZESTRIL) 20 MG tablet Take 20 mg by mouth daily. 04/09/19   [provider]  montelukast (SINGULAIR) 10 MG tablet Take 10 mg by mouth at bedtime. 05/25/19   [provider]      Allergies    Patient has no known allergies.    Review of Systems   Review of Systems   Gastrointestinal:  Positive for abdominal pain.  Genitourinary:  Positive for flank pain.    Physical Exam Updated Vital Signs BP (!) 130/96   Pulse (!) 54   Temp 98.4 F (36.9 C) (Oral)   Resp 18   Ht 6\' 3"  (1.905 m)   Wt 113.4 kg   SpO2 99%   BMI 31.25 kg/m  Physical Exam Vitals and nursing note reviewed.  Constitutional:      Appearance: Normal appearance.  HENT:     Head: Normocephalic and atraumatic.     Mouth/Throat:     Mouth: Mucous membranes are moist.  Eyes:     General: No scleral icterus. Cardiovascular:     Rate and Rhythm: Normal rate and regular rhythm.     Pulses: Normal pulses.     Heart sounds: Normal heart sounds.  Pulmonary:     Effort: Pulmonary effort is normal.     Breath sounds: Normal breath sounds.  Abdominal:     General: Abdomen is flat.     Palpations: Abdomen is soft.     Tenderness: There is no abdominal tenderness.  Musculoskeletal:        General: No deformity.     Comments: TTP to right upper quadrant.  Negative CVA tenderness.  Skin:    General: Skin is warm.     Findings: No rash.  Neurological:     General: No focal deficit present.     Mental Status: He is alert.  Psychiatric:  Mood and Affect: Mood normal.    ED Results / Procedures / Treatments   Labs (all labs ordered are listed, but only abnormal results are displayed) Labs Reviewed  COMPREHENSIVE METABOLIC PANEL - Abnormal; Notable for the following components:      Result Value   Glucose, Bld 149 (*)    Creatinine, Ser 1.96 (*)    ALT 45 (*)    GFR, Estimated 44 (*)    All other components within normal limits  URINALYSIS, ROUTINE W REFLEX MICROSCOPIC - Abnormal; Notable for the following components:   Color, Urine COLORLESS (*)    All other components within normal limits  LIPASE, BLOOD  CBC    EKG None  Radiology No results found.  Procedures Procedures  {Document cardiac monitor, telemetry assessment procedure when  appropriate:1}  Medications Ordered in ED Medications  acetaminophen (TYLENOL) tablet 650 mg (has no administration in time range)    ED Course/ Medical Decision Making/ A&P                           Medical Decision Making Amount and/or Complexity of Data Reviewed Labs: ordered. Radiology: ordered.  Risk OTC drugs.   This patient presents to the ED for concern of ***, this involves an extensive number of treatment options, and is a complaint that carries with it a high risk of complications and morbidity.  The differential diagnosis includes *** Co morbidities that complicate the patient evaluation  See HPI Additional history obtained:  Additional history obtained from EMR External records from outside source obtained and reviewed  Lab Tests:  I Ordered, and personally interpreted labs.  The pertinent results include:   No leukocytosis noted.   No evidence of anemia.   Platelets within normal range.   No electrolyte abnormalities noted.   Renal function within normal limits.   No transaminitis noted.  UA significant for no acute abnormalities. *** Imaging Studies ordered:  I ordered imaging studies including: ***  I independently visualized and interpreted imaging. I agree with the radiologist interpretation Cardiac Monitoring: / EKG:  The patient was maintained on a cardiac monitor.  I personally viewed and interpreted the cardiac monitored which showed an underlying rhythm of: sinus rhythm Consultations Obtained:  I requested consultation with the ***,  and discussed lab and imaging findings as well as pertinent plan - they recommend: *** Problem List / ED Course / Critical interventions / Medication management  HPI: see above Vitals signs within normal range and stable throughout visit. Laboratory/imaging studies significant for: See above On physical examination, patient is afebrile and appears in no acute distress. Patient's presentations are most concerned  for ***. Low suspicion for ***. I ordered medication including ***  Reevaluation of the patient after these medicines showed that the patient {resolved/improved/worsened:23923::"improved"} I have reviewed the patients home medicines and have made adjustments as needed Social Determinants of Health:  N/A Test / Admission / Dispo - Considered:  Continued outpatient therapy. Follow-up with PCP *** recommended for reevaluation of symptoms. Treatment plan discussed with patient.  Pt acknowledged understanding was agreeable to the plan. Worrisome signs and symptoms were discussed with patient, and patient acknowledged understanding to return to the ED if they noticed these signs and symptoms. Patient was stable upon discharge.    {Document critical care time when appropriate:1} {Document review of labs and clinical decision tools ie heart score, Chads2Vasc2 etc:1}  {Document your independent review of radiology images, and any outside  records:1} {Document your discussion with family members, caretakers, and with consultants:1} {Document social determinants of health affecting pt's care:1} {Document your decision making why or why not admission, treatments were needed:1} Final Clinical Impression(s) / ED Diagnoses Final diagnoses:  None    Rx / DC Orders ED Discharge Orders     None

## 2022-11-09 ENCOUNTER — Other Ambulatory Visit: Payer: Self-pay

## 2022-11-09 ENCOUNTER — Encounter (HOSPITAL_BASED_OUTPATIENT_CLINIC_OR_DEPARTMENT_OTHER): Payer: Self-pay | Admitting: Emergency Medicine

## 2022-11-09 ENCOUNTER — Emergency Department (HOSPITAL_BASED_OUTPATIENT_CLINIC_OR_DEPARTMENT_OTHER)
Admission: EM | Admit: 2022-11-09 | Discharge: 2022-11-09 | Disposition: A | Discharge status: Home / Self Care | Payer: Managed Care, Other (non HMO) | Attending: Emergency Medicine | Admitting: Emergency Medicine

## 2022-11-09 DIAGNOSIS — Z79899 Other long term (current) drug therapy: Secondary | ICD-10-CM | POA: Insufficient documentation

## 2022-11-09 DIAGNOSIS — I10 Essential (primary) hypertension: Secondary | ICD-10-CM | POA: Diagnosis not present

## 2022-11-09 DIAGNOSIS — Z20822 Contact with and (suspected) exposure to covid-19: Secondary | ICD-10-CM | POA: Diagnosis not present

## 2022-11-09 DIAGNOSIS — J029 Acute pharyngitis, unspecified: Secondary | ICD-10-CM

## 2022-11-09 DIAGNOSIS — J101 Influenza due to other identified influenza virus with other respiratory manifestations: Secondary | ICD-10-CM | POA: Diagnosis not present

## 2022-11-09 LAB — GROUP A STREP BY PCR: Group A Strep by PCR: NOT DETECTED

## 2022-11-09 LAB — RESP PANEL BY RT-PCR (RSV, FLU A&B, COVID)  RVPGX2
Influenza A by PCR: NEGATIVE
Influenza B by PCR: POSITIVE — AB
Resp Syncytial Virus by PCR: NEGATIVE
SARS Coronavirus 2 by RT PCR: NEGATIVE

## 2022-11-09 NOTE — Discharge Instructions (Addendum)
Note the workup today was overall reassuring.  He tested positive for influenza B.  Recommend taking daily allergy medicine in the form of Zyrtec/Allegra/Claritin as well as nasal steroid spray in the form of Nasacort/Flonase.  Recommend Tylenol/Motrin as needed for pain as well as continuation of your current cough suppressant.  Recommend follow-up with primary care for reassessment of your symptoms.  Please do not hesitate to return to emergency department for worrisome signs and symptoms we discussed become apparent.

## 2022-11-09 NOTE — ED Provider Notes (Signed)
[facility] Provider Note   CSN: [id number] Arrival date & time: 11/09/22  1102     History  Chief Complaint  Patient presents with   Sore Throat    Harold Young is a 37 y.o. male.   Sore Throat   37 year old male presents emergency department with complaints of sore throat.  Patient reports history of mild sore throat approximate 1 week ago with worsening symptoms over the past day.  Reports being able to eat and drink without difficulty.  States that multiple family members have tested positive for flu.  Reports mild cough of which has been relieved with prescription cough suppressant.  Denies fever, chest pain, shortness of breath, abdominal pain, nausea, vomiting.  Past medical history significant for hypertension, headache, chronic back pain, sciatica  Home Medications Prior to Admission medications   Medication Sig Start Date End Date Taking? Authorizing Provider  cyclobenzaprine (FLEXERIL) 10 MG tablet Take 1 tablet (10 mg total) by mouth 2 (two) times daily as needed for muscle spasms. 07/06/20   McDonald, Mia A, PA-C  diclofenac Sodium (VOLTAREN) 1 % GEL Apply 2 g topically 4 (four) times daily. 09/29/19   Caccavale, Sophia, PA-C  escitalopram (LEXAPRO) 20 MG tablet Take 20 mg by mouth daily. 03/24/19   [provider]  hydrochlorothiazide (HYDRODIURIL) 12.5 MG tablet Take 12.5 mg by mouth daily. 05/25/19   [provider]  Lidocaine (HM LIDOCAINE PATCH) 4 % PTCH Apply 1 patch topically daily. 07/06/20   McDonald, Mia A, PA-C  lisinopril (ZESTRIL) 20 MG tablet Take 20 mg by mouth daily. 04/09/19   [provider]  montelukast (SINGULAIR) 10 MG tablet Take 10 mg by mouth at bedtime. 05/25/19   [provider]      Allergies    Patient has no known allergies.    Review of Systems   Review of Systems  All other systems reviewed and are negative.   Physical Exam Updated Vital Signs BP (!) 149/99  (BP Location: Right Arm)   Pulse 67   Temp 98.2 F (36.8 C) (Oral)   Resp 18   SpO2 100%  Physical Exam Vitals and nursing note reviewed.  Constitutional:      General: He is not in acute distress.    Appearance: He is well-developed.  HENT:     Head: Normocephalic and atraumatic.     Right Ear: Tympanic membrane and ear canal normal.     Left Ear: Tympanic membrane and ear canal normal.     Nose: Congestion and rhinorrhea present.     Mouth/Throat:     Comments: Posterior pharyngeal erythema.  Uvula midline rise symmetric with phonation.  Tonsils are 1+ bilaterally with no obvious exudate.  No sublingual or submandibular swelling appreciated. Eyes:     Conjunctiva/sclera: Conjunctivae normal.  Cardiovascular:     Rate and Rhythm: Normal rate and regular rhythm.     Heart sounds: No murmur heard. Pulmonary:     Effort: Pulmonary effort is normal. No respiratory distress.     Breath sounds: Normal breath sounds. No wheezing, rhonchi or rales.  Abdominal:     Palpations: Abdomen is soft.     Tenderness: There is no abdominal tenderness.  Musculoskeletal:        General: No swelling.     Cervical back: Neck supple.  Skin:    General: Skin is warm and dry.     Capillary Refill: Capillary refill takes less than 2 seconds.  Neurological:     Mental Status: He is alert.  Psychiatric:        Mood and Affect: Mood normal.     ED Results / Procedures / Treatments   Labs (all labs ordered are listed, but only abnormal results are displayed) Labs Reviewed  RESP PANEL BY RT-PCR (RSV, FLU A&B, COVID)  RVPGX2 - Abnormal; Notable for the following components:      Result Value   Influenza B by PCR POSITIVE (*)    All other components within normal limits  GROUP A STREP BY PCR    EKG None  Radiology No results found.  Procedures Procedures    Medications Ordered in ED Medications - No data to display  ED Course/ Medical Decision Making/ A&P Clinical Course as of  11/09/22 1221  Sun Nov 09, 2022  1144 Sore throat x 1 week [CR]    Clinical Course User Index [CR] Wilnette Kales, PA                             Medical Decision Making  This patient presents to the ED for concern of influenza-like illness, this involves an extensive number of treatment options, and is a complaint that carries with it a high risk of complications and morbidity.  The differential diagnosis includes influenza, COVID, RSV, pharyngitis bacterial/viral, peritonsillar abscess, periapical abscess, Ludwig angina, anaphylaxis, Lemierre's disease, pneumonia, sepsis, meningitis, angioedema   Co morbidities that complicate the patient evaluation  See HPI   Additional history obtained:  Additional history obtained from EMR External records from outside source obtained and reviewed including hospital records   Lab Tests:  I Ordered, and personally interpreted labs.  The pertinent results include: Respiratory viral panel positive for influenza B.  Group A strep negative.   Imaging Studies ordered:  N/s   Cardiac Monitoring: / EKG:  The patient was maintained on a cardiac monitor.  I personally viewed and interpreted the cardiac monitored which showed an underlying rhythm of: Sinus rhythm   Consultations Obtained:  N/a   Problem List / ED Course / Critical interventions / Medication management  Influenza B Reevaluation of the patient showed that the patient stayed the same I have reviewed the patients home medicines and have made adjustments as needed   Social Determinants of Health:  Denies illicit drug use   Test / Admission - Considered:  Influenza B Vitals signs significant for mild hypertension with a blood pressure 149/99 most likely secondary to patient's current decongestion use. Otherwise within normal range and stable throughout visit. Laboratory/imaging studies significant for: See above Patient with most complaints of sore throat.  No  evidence of peritonsillar abscess, Ludwig angina, Lemierre's disease.  Symptoms seem most likely secondary to current influenza B infection with postnasal drip exacerbating symptoms.  Treat symptomatically with antihistamine as well as nasal steroid spray and Tylenol/Motrin as needed for pain.  Recommend follow-up with primary care for reassessment of symptoms.  Patient overall well-appearing, afebrile in no acute respiratory distress.  Treatment plan discussed at length with patient and he acknowledged understanding was agreeable to said plan. Worrisome signs and symptoms were discussed with the patient, and the patient acknowledged understanding to return to the ED if noticed. Patient was stable upon discharge.          Final Clinical Impression(s) / ED Diagnoses Final diagnoses:  Influenza B  Sore throat    Rx / DC Orders ED Discharge Orders  None         Wilnette Kales, [place] 11/09/22 1221    Charlesetta Shanks, MD 11/13/22 [phone]

## 2022-11-09 NOTE — ED Triage Notes (Signed)
Pt presents to ED Pov. Pt c/o soore throat x1w. Pt reports that other family members have the flu. Tested for flu and strep 2-3d ago and both negative

## 2023-03-01 ENCOUNTER — Emergency Department (HOSPITAL_BASED_OUTPATIENT_CLINIC_OR_DEPARTMENT_OTHER)
Admission: EM | Admit: 2023-03-01 | Discharge: 2023-03-02 | Disposition: A | Discharge status: Home / Self Care | Payer: Managed Care, Other (non HMO) | Attending: Emergency Medicine | Admitting: Emergency Medicine

## 2023-03-01 ENCOUNTER — Encounter (HOSPITAL_BASED_OUTPATIENT_CLINIC_OR_DEPARTMENT_OTHER): Payer: Self-pay | Admitting: Emergency Medicine

## 2023-03-01 DIAGNOSIS — Z79899 Other long term (current) drug therapy: Secondary | ICD-10-CM | POA: Insufficient documentation

## 2023-03-01 DIAGNOSIS — J45909 Unspecified asthma, uncomplicated: Secondary | ICD-10-CM | POA: Insufficient documentation

## 2023-03-01 DIAGNOSIS — M79672 Pain in left foot: Secondary | ICD-10-CM | POA: Insufficient documentation

## 2023-03-01 DIAGNOSIS — I10 Essential (primary) hypertension: Secondary | ICD-10-CM | POA: Diagnosis not present

## 2023-03-01 NOTE — ED Triage Notes (Addendum)
Left heel/ low ankle pain x 5 days. Denies known injury.  Swelling

## 2023-03-01 NOTE — ED Notes (Signed)
Patient ambulatory to restroom with steady gait.

## 2023-03-02 ENCOUNTER — Emergency Department (HOSPITAL_BASED_OUTPATIENT_CLINIC_OR_DEPARTMENT_OTHER): Payer: Managed Care, Other (non HMO)

## 2023-03-02 MED ORDER — NAPROXEN 500 MG PO TABS: 500.0000 mg | ORAL_TABLET | Freq: Two times a day (BID) | ORAL | 0 refills | Status: DC

## 2023-03-02 NOTE — ED Provider Notes (Signed)
[facility] EMERGENCY DEPARTMENT AT [facility] Provider Note   CSN: [id number] Arrival date & time: 03/01/23  2250     History  Chief Complaint  Patient presents with   Foot Pain    Harold Young is a 37 y.o. male.  Patient is a 37 year old male with history of asthma and hypertension.  Patient presenting today for evaluation of left foot/heel pain.  This has been ongoing for the past week.  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  Pain became worse this evening while at work and presents for evaluation of this.  Pain is worse with ambulation and palpation.  It is somewhat relieved with rest.  The history is provided by the patient.       Home Medications Prior to Admission medications   Medication Sig Start Date End Date Taking? Authorizing Provider  cyclobenzaprine (FLEXERIL) 10 MG tablet Take 1 tablet (10 mg total) by mouth 2 (two) times daily as needed for muscle spasms. 07/06/20   McDonald, Mia A, PA-C  diclofenac Sodium (VOLTAREN) 1 % GEL Apply 2 g topically 4 (four) times daily. 09/29/19   Caccavale, Sophia, PA-C  escitalopram (LEXAPRO) 20 MG tablet Take 20 mg by mouth daily. 03/24/19   [provider]  hydrochlorothiazide (HYDRODIURIL) 12.5 MG tablet Take 12.5 mg by mouth daily. 05/25/19   [provider]  Lidocaine (HM LIDOCAINE PATCH) 4 % PTCH Apply 1 patch topically daily. 07/06/20   McDonald, Mia A, PA-C  lisinopril (ZESTRIL) 20 MG tablet Take 20 mg by mouth daily. 04/09/19   [provider]  montelukast (SINGULAIR) 10 MG tablet Take 10 mg by mouth at bedtime. 05/25/19   [provider]      Allergies    Patient has no known allergies.    Review of Systems   Review of Systems  All other systems reviewed and are negative.   Physical Exam Updated Vital Signs BP (!) 146/91 (BP Location: Right Arm)   Pulse 76   Temp 98.2 F (36.8 C)   Resp 16   SpO2  95%  Physical Exam Vitals and nursing note reviewed.  Constitutional:      Appearance: Normal appearance.  Pulmonary:     Effort: Pulmonary effort is normal.  Musculoskeletal:     Comments: The left foot is grossly normal in appearance.  There is no significant swelling at the heel.  There is tenderness at the insertion site of the Achilles tendon to the back of the heel, but no redness, deformity, or erythema.  He is able to dorsiflex and plantarflex without difficulty.  Skin:    General: Skin is warm and dry.  Neurological:     Mental Status: He is alert.     ED Results / Procedures / Treatments   Labs (all labs ordered are listed, but only abnormal results are displayed) Labs Reviewed - No data to display  EKG None  Radiology No results found.  Procedures Procedures  {Document cardiac monitor, telemetry assessment procedure when appropriate:1}  Medications Ordered in ED Medications - No data to display  ED Course/ Medical Decision Making/ A&P   {   Click here for ABCD2, HEART and other calculatorsREFRESH Note before signing :1}                          Medical Decision Making Amount and/or Complexity of Data Reviewed Radiology: ordered.   ***  {  Document critical care time when appropriate:1} {Document review of labs and clinical decision tools ie heart score, Chads2Vasc2 etc:1}  {Document your independent review of radiology images, and any outside records:1} {Document your discussion with family members, caretakers, and with consultants:1} {Document social determinants of health affecting pt's care:1} {Document your decision making why or why not admission, treatments were needed:1} Final Clinical Impression(s) / ED Diagnoses Final diagnoses:  None    Rx / DC Orders ED Discharge Orders     None

## 2023-03-02 NOTE — ED Notes (Signed)
Reviewed AVS with patient, patient expressed understanding of directions, denies further questions at this time. 

## 2023-03-02 NOTE — Discharge Instructions (Signed)
Begin taking naproxen as prescribed.  Wear Ace bandage for comfort and support.  Follow-up with primary doctor if not improving in the next 1 to 2 weeks.

## 2023-03-02 NOTE — ED Notes (Signed)
Ace wrap applied to pt left foot/ankle.  

## 2023-04-05 ENCOUNTER — Emergency Department (HOSPITAL_BASED_OUTPATIENT_CLINIC_OR_DEPARTMENT_OTHER): Payer: Managed Care, Other (non HMO)

## 2023-04-05 ENCOUNTER — Encounter (HOSPITAL_BASED_OUTPATIENT_CLINIC_OR_DEPARTMENT_OTHER): Payer: Self-pay

## 2023-04-05 ENCOUNTER — Emergency Department (HOSPITAL_BASED_OUTPATIENT_CLINIC_OR_DEPARTMENT_OTHER)
Admission: EM | Admit: 2023-04-05 | Discharge: 2023-04-05 | Disposition: A | Discharge status: Home / Self Care | Payer: Managed Care, Other (non HMO) | Attending: Emergency Medicine | Admitting: Emergency Medicine

## 2023-04-05 ENCOUNTER — Other Ambulatory Visit: Payer: Self-pay

## 2023-04-05 DIAGNOSIS — M79671 Pain in right foot: Secondary | ICD-10-CM

## 2023-04-05 DIAGNOSIS — M109 Gout, unspecified: Secondary | ICD-10-CM | POA: Insufficient documentation

## 2023-04-05 MED ORDER — PREDNISONE 20 MG PO TABS: 40.0000 mg | ORAL_TABLET | Freq: Every day | ORAL | 0 refills | Status: AC

## 2023-04-05 MED ORDER — NAPROXEN 500 MG PO TABS: 500.0000 mg | ORAL_TABLET | Freq: Two times a day (BID) | ORAL | 0 refills | Status: AC

## 2023-04-05 NOTE — ED Triage Notes (Signed)
Patient arrives with complaints on increased right foot pain x3 days. Patient is now having swelling to foot as well. Rates pain a 9/10 with ambulation. No falls or injuries.

## 2023-04-05 NOTE — ED Provider Notes (Signed)
[place] EMERGENCY DEPARTMENT AT [facility] Provider Note   CSN: [id number] Arrival date & time: 04/05/23  1205     History  Chief Complaint  Patient presents with   Foot Pain   Foot Swelling    right    Harold Young is a 37 y.o. male.  37 y.o male with a PMH of Gout presents to the ED with chief complaint of right foot pain and swelling that is been ongoing for the past 3 days.  Patient reports taking a Roxie at home, falling asleep but waking up and noticed his foot to be more swollen.  There is pain at the base of the hallux, exacerbated with any palpation along with ambulation.  He does have a prior history of gout to the right knee, is currently not on any therapy to help event recurrence.  He has not had any falls, no fever, no prior history of IV drug use.  The history is provided by the patient.  Foot Pain       Home Medications Prior to Admission medications   Medication Sig Start Date End Date Taking? Authorizing Provider  naproxen (NAPROSYN) 500 MG tablet Take 1 tablet (500 mg total) by mouth 2 (two) times daily for 7 days. 04/05/23 04/12/23 Yes Gaytha Raybourn, Leonie Douglas, PA-C  predniSONE (DELTASONE) 20 MG tablet Take 2 tablets (40 mg total) by mouth daily for 5 days. 04/05/23 04/10/23 Yes Marry Kusch, Leonie Douglas, PA-C  cyclobenzaprine (FLEXERIL) 10 MG tablet Take 1 tablet (10 mg total) by mouth 2 (two) times daily as needed for muscle spasms. 07/06/20   McDonald, Mia A, PA-C  diclofenac Sodium (VOLTAREN) 1 % GEL Apply 2 g topically 4 (four) times daily. 09/29/19   Caccavale, Sophia, PA-C  escitalopram (LEXAPRO) 20 MG tablet Take 20 mg by mouth daily. 03/24/19   [provider]  hydrochlorothiazide (HYDRODIURIL) 12.5 MG tablet Take 12.5 mg by mouth daily. 05/25/19   [provider]  Lidocaine (HM LIDOCAINE PATCH) 4 % PTCH Apply 1 patch topically daily. 07/06/20   McDonald, Mia A, PA-C  lisinopril (ZESTRIL) 20 MG tablet Take 20 mg by mouth daily. 04/09/19   [provider]  montelukast (SINGULAIR) 10 MG tablet Take 10 mg by mouth at bedtime. 05/25/19   [provider]      Allergies    Patient has no known allergies.    Review of Systems   Review of Systems  Constitutional:  Negative for fever.  Musculoskeletal:  Positive for arthralgias.    Physical Exam Updated Vital Signs BP 135/85 (BP Location: Left Arm)   Pulse 74   Temp 98.3 F (36.8 C) (Oral)   Resp 18   Ht 6\' 3"  (1.905 m)   Wt 111.1 kg   SpO2 100%   BMI 30.62 kg/m  Physical Exam Vitals and nursing note reviewed.  Constitutional:      Appearance: Normal appearance.  HENT:     Head: Normocephalic and atraumatic.     Mouth/Throat:     Mouth: Mucous membranes are moist.  Cardiovascular:     Rate and Rhythm: Normal rate.     Pulses:          Dorsalis pedis pulses are 2+ on the right side.       Posterior tibial pulses are 2+ on the right side.  Pulmonary:     Effort: Pulmonary effort is normal.  Abdominal:     General: Abdomen is flat.  Musculoskeletal:     Cervical back:  Normal range of motion and neck supple.  Feet:     Right foot:     Skin integrity: Erythema present.     Comments: Erythema to the base of the right great toe, 2+, PT and DP pulses.  Sensation is intact.  Good dorsiflexion and plantarflexion. Skin:    General: Skin is warm and dry.  Neurological:     Mental Status: He is alert and oriented to person, place, and time.     ED Results / Procedures / Treatments   Labs (all labs ordered are listed, but only abnormal results are displayed) Labs Reviewed - No data to display  EKG None  Radiology DG Foot Complete Right  Result Date: 04/05/2023 CLINICAL DATA:  Atraumatic right foot pain EXAM: RIGHT FOOT COMPLETE - 3+ VIEW COMPARISON:  02/21/2019 FINDINGS: There is no evidence of fracture or dislocation. Minimal arthropathy within the midfoot. Tiny enthesophyte at the Achilles tendon insertion. Soft tissues are unremarkable. IMPRESSION:  Minimal arthropathy within the midfoot. No acute findings. Electronically Signed   By: Duanne Guess D.O.   On: 04/05/2023 13:33    Procedures Procedures    Medications Ordered in ED Medications - No data to display  ED Course/ Medical Decision Making/ A&P                             Medical Decision Making Amount and/or Complexity of Data Reviewed Radiology: ordered.   Patient presents to the ED with a chief complaint of right foot pain that began 2 days ago.  Prior history of gout to his right knee, feels the pain is severe exacerbated with ambulation.  Has not had any fevers, no skin changes to suggest infection.  He does not have any injury or falls.  X-ray obtained did not show any fracture or dislocation.  Significant tenderness to palpation along the right base of the great toe, suspicion for gout as he has had a prior history of this, does report increase in red meats over the last couple of days.  We discussed treatment with steroids, along with anti-inflammatories to help with pain control.  He is agreeable to plan and treatment at this time.  I do not suspect infection, no prior history of IV drug use.  Patient is hemodynamically stable for discharge.  Portions of this note were generated with Scientist, clinical (histocompatibility and immunogenetics). Dictation errors may occur despite best attempts at proofreading.  Final Clinical Impression(s) / ED Diagnoses Final diagnoses:  Right foot pain  Acute gout involving toe of right foot, unspecified cause    Rx / DC Orders ED Discharge Orders          Ordered    predniSONE (DELTASONE) 20 MG tablet  Daily        04/05/23 1351    naproxen (NAPROSYN) 500 MG tablet  2 times daily        04/05/23 1351              Claude Manges, PA-C 04/05/23 1355    Ernie Avena, MD 04/05/23 1359

## 2023-04-05 NOTE — Discharge Instructions (Addendum)
I have prescribed steroids to help this acute likely gout, you will need to take 2 tablets daily for the next 5 days.  Please be aware this medication can cause flushness, insomnia, and appetite changes.   I have also prescribed anti-inflammatories to help with your pain, please take 1 tablet twice a day with food for the next 7 days.

## 2023-08-11 ENCOUNTER — Other Ambulatory Visit: Payer: Self-pay | Admitting: Nephrology

## 2023-08-11 DIAGNOSIS — Q613 Polycystic kidney, unspecified: Secondary | ICD-10-CM

## 2023-08-12 ENCOUNTER — Ambulatory Visit
Admission: RE | Admit: 2023-08-12 | Discharge: 2023-08-12 | Disposition: A | Discharge status: Home / Self Care | Payer: Managed Care, Other (non HMO) | Source: Ambulatory Visit | Attending: Nephrology | Admitting: Nephrology

## 2023-08-12 DIAGNOSIS — Q613 Polycystic kidney, unspecified: Secondary | ICD-10-CM

## 2023-10-15 ENCOUNTER — Emergency Department (HOSPITAL_BASED_OUTPATIENT_CLINIC_OR_DEPARTMENT_OTHER)
Admission: EM | Admit: 2023-10-15 | Discharge: 2023-10-15 | Disposition: A | Discharge status: Home / Self Care | Payer: Managed Care, Other (non HMO) | Attending: Emergency Medicine | Admitting: Emergency Medicine

## 2023-10-15 ENCOUNTER — Other Ambulatory Visit: Payer: Self-pay

## 2023-10-15 ENCOUNTER — Encounter (HOSPITAL_BASED_OUTPATIENT_CLINIC_OR_DEPARTMENT_OTHER): Payer: Self-pay | Admitting: Emergency Medicine

## 2023-10-15 DIAGNOSIS — M25572 Pain in left ankle and joints of left foot: Secondary | ICD-10-CM | POA: Insufficient documentation

## 2023-10-15 MED ORDER — PREDNISONE 50 MG PO TABS
60.0000 mg | ORAL_TABLET | Freq: Once | ORAL | Status: AC
  Administered 2023-10-15: 60 mg via ORAL
  Filled 2023-10-15: qty 1

## 2023-10-15 MED ORDER — COLCHICINE 0.6 MG PO TABS: ORAL_TABLET | ORAL | 0 refills | Status: AC

## 2023-10-15 MED ORDER — PREDNISONE 20 MG PO TABS: ORAL_TABLET | ORAL | 0 refills | Status: DC

## 2023-10-15 MED ORDER — AMOXICILLIN-POT CLAVULANATE 875-125 MG PO TABS
1.0000 | ORAL_TABLET | Freq: Once | ORAL | Status: AC
  Administered 2023-10-15: 1 via ORAL
  Filled 2023-10-15: qty 1

## 2023-10-15 MED ORDER — AMOXICILLIN-POT CLAVULANATE 875-125 MG PO TABS: 1.0000 | ORAL_TABLET | Freq: Two times a day (BID) | ORAL | 0 refills | Status: AC

## 2023-10-15 NOTE — Discharge Instructions (Signed)
Is hard to tell what is wrong with your ankle.  I am going to treat you like maybe it is infected and may be like you have gout.  Please follow-up with your family doctor in the office.  Please return for redness or if you develop a fever.  Try to keep your weight off of it as best as you can for at least the next few days.

## 2023-10-15 NOTE — ED Provider Notes (Signed)
[place] EMERGENCY DEPARTMENT AT [facility] Provider Note   CSN: [id number] Arrival date & time: 10/15/23  6578     History  Chief Complaint  Patient presents with   Ankle Pain    L    Harold Young is a 38 y.o. male.  38 yo M with a chief complaint of left ankle pain.  Going on for couple days.  Denies injury initially and then later states that he was bit by a small dog a few days earlier.  Denies fevers.  Feels like its gotten progressively worse.  Swelling mostly to the lateral aspect of the ankle.  He has a history of gout for 4 and thinks this feels somewhat similar but was on the other side previously.   Ankle Pain      Home Medications Prior to Admission medications   Medication Sig Start Date End Date Taking? Authorizing Provider  amoxicillin-clavulanate (AUGMENTIN) 875-125 MG tablet Take 1 tablet by mouth every 12 (twelve) hours. 10/15/23  Yes Melene Plan, DO  colchicine 0.6 MG tablet Take two tablets by mouth.  Then an hour later take the last tablet. 10/15/23  Yes Melene Plan, DO  predniSONE (DELTASONE) 20 MG tablet 2 tabs po daily x 4 days 10/15/23  Yes Melene Plan, DO  cyclobenzaprine (FLEXERIL) 10 MG tablet Take 1 tablet (10 mg total) by mouth 2 (two) times daily as needed for muscle spasms. 07/06/20   McDonald, Mia A, PA-C  diclofenac Sodium (VOLTAREN) 1 % GEL Apply 2 g topically 4 (four) times daily. 09/29/19   Caccavale, Sophia, PA-C  escitalopram (LEXAPRO) 20 MG tablet Take 20 mg by mouth daily. 03/24/19   [provider]  hydrochlorothiazide (HYDRODIURIL) 12.5 MG tablet Take 12.5 mg by mouth daily. 05/25/19   [provider]  Lidocaine (HM LIDOCAINE PATCH) 4 % PTCH Apply 1 patch topically daily. 07/06/20   McDonald, Mia A, PA-C  lisinopril (ZESTRIL) 20 MG tablet Take 20 mg by mouth daily. 04/09/19   [provider]  montelukast (SINGULAIR) 10 MG tablet Take 10 mg by mouth at bedtime. 05/25/19   [provider]       Allergies    Patient has no known allergies.    Review of Systems   Review of Systems  Physical Exam Updated Vital Signs BP (!) 165/103 (BP Location: Right Arm)   Pulse (!) 105   Temp 97.9 F (36.6 C) (Oral)   Resp 15   SpO2 100%  Physical Exam Vitals and nursing note reviewed.  Constitutional:      Appearance: He is well-developed.  HENT:     Head: Normocephalic and atraumatic.  Eyes:     Pupils: Pupils are equal, round, and reactive to light.  Neck:     Vascular: No JVD.  Cardiovascular:     Rate and Rhythm: Normal rate and regular rhythm.     Heart sounds: No murmur heard.    No friction rub. No gallop.  Pulmonary:     Effort: No respiratory distress.     Breath sounds: No wheezing.  Abdominal:     General: There is no distension.     Tenderness: There is no abdominal tenderness. There is no guarding or rebound.  Musculoskeletal:        General: Normal range of motion.     Cervical back: Normal range of motion and neck supple.     Comments: Mostly localized swelling and bogginess just inferior and posterior to the lateral malleolus.  There is some mild edema medially.  Pulse motor and sensation are intact.  I am able to range the ankle without significant discomfort.  No significant warmth or erythema.  No drainage.  Skin:    Coloration: Skin is not pale.     Findings: No rash.  Neurological:     Mental Status: He is alert and oriented to person, place, and time.  Psychiatric:        Behavior: Behavior normal.     ED Results / Procedures / Treatments   Labs (all labs ordered are listed, but only abnormal results are displayed) Labs Reviewed - No data to display  EKG None  Radiology No results found.  Procedures Procedures    Medications Ordered in ED Medications  predniSONE (DELTASONE) tablet 60 mg (has no administration in time range)  amoxicillin-clavulanate (AUGMENTIN) 875-125 MG per tablet 1 tablet (has no administration in time range)     ED Course/ Medical Decision Making/ A&P                                 Medical Decision Making Risk Prescription drug management.   38 yo M with a chief complaint of left ankle pain and swelling.  Going on for a couple days.  Getting progressively worse.  It is hard to tell what this is clinically.  Not completely consistent with gout, it seems more of a localized effusion to the lateral aspect of the ankle.  Could be that the patient had a ligamentous injury while trying to evade the dog that bit him.  I think less likely a localized skin infection.  Seems unlikely to be septic arthritis based on history and exam.  I will treat for the possibility of a skin infection as well as possibility of sprain or gout.  ASO crutches PCP follow-up.  3:28 AM:  I have discussed the diagnosis/risks/treatment options with the patient.  Evaluation and diagnostic testing in the emergency department does not suggest an emergent condition requiring admission or immediate intervention beyond what has been performed at this time.  They will follow up with PCP. We also discussed returning to the ED immediately if new or worsening sx occur. We discussed the sx which are most concerning (e.g., sudden worsening pain, fever, inability to tolerate by mouth) that necessitate immediate return. Medications administered to the patient during their visit and any new prescriptions provided to the patient are listed below.  Medications given during this visit Medications  predniSONE (DELTASONE) tablet 60 mg (has no administration in time range)  amoxicillin-clavulanate (AUGMENTIN) 875-125 MG per tablet 1 tablet (has no administration in time range)     The patient appears reasonably screen and/or stabilized for discharge and I doubt any other medical condition or other [facility] requiring further screening, evaluation, or treatment in the ED at this time prior to discharge.          Final Clinical Impression(s) / ED  Diagnoses Final diagnoses:  Acute left ankle pain    Rx / DC Orders ED Discharge Orders          Ordered    amoxicillin-clavulanate (AUGMENTIN) 875-125 MG tablet  Every 12 hours        10/15/23 0324    colchicine 0.6 MG tablet        10/15/23 0324    predniSONE (DELTASONE) 20 MG tablet        10/15/23 0324  Melene Plan, DO 10/15/23 [id number]

## 2023-10-15 NOTE — ED Triage Notes (Signed)
Pt reports ongoing left ankle pain x 2 days. No known injury or trauma. Hx gout on right knee and right ankle. No currently taking medications.

## 2024-02-29 ENCOUNTER — Emergency Department (HOSPITAL_BASED_OUTPATIENT_CLINIC_OR_DEPARTMENT_OTHER)
Admission: EM | Admit: 2024-02-29 | Discharge: 2024-02-29 | Disposition: A | Discharge status: Home / Self Care | Attending: Emergency Medicine | Admitting: Emergency Medicine

## 2024-02-29 ENCOUNTER — Other Ambulatory Visit: Payer: Self-pay

## 2024-02-29 ENCOUNTER — Emergency Department (HOSPITAL_BASED_OUTPATIENT_CLINIC_OR_DEPARTMENT_OTHER): Admitting: Radiology

## 2024-02-29 DIAGNOSIS — Z79899 Other long term (current) drug therapy: Secondary | ICD-10-CM | POA: Insufficient documentation

## 2024-02-29 DIAGNOSIS — X500XXA Overexertion from strenuous movement or load, initial encounter: Secondary | ICD-10-CM | POA: Diagnosis not present

## 2024-02-29 DIAGNOSIS — Y990 Civilian activity done for income or pay: Secondary | ICD-10-CM | POA: Insufficient documentation

## 2024-02-29 DIAGNOSIS — I10 Essential (primary) hypertension: Secondary | ICD-10-CM | POA: Diagnosis not present

## 2024-02-29 DIAGNOSIS — M545 Low back pain, unspecified: Secondary | ICD-10-CM | POA: Diagnosis not present

## 2024-02-29 MED ORDER — KETOROLAC TROMETHAMINE 30 MG/ML IJ SOLN
30.0000 mg | Freq: Once | INTRAMUSCULAR | Status: AC
  Administered 2024-02-29: 30 mg via INTRAMUSCULAR
  Filled 2024-02-29: qty 1

## 2024-02-29 MED ORDER — CELECOXIB 200 MG PO CAPS: 200.0000 mg | ORAL_CAPSULE | Freq: Two times a day (BID) | ORAL | 0 refills | Status: AC | PRN

## 2024-02-29 MED ORDER — CYCLOBENZAPRINE HCL 10 MG PO TABS: 10.0000 mg | ORAL_TABLET | Freq: Two times a day (BID) | ORAL | 0 refills | Status: AC | PRN

## 2024-02-29 MED ORDER — LIDOCAINE 5 % EX PTCH
1.0000 | MEDICATED_PATCH | CUTANEOUS | Status: DC
Start: 2024-02-29 — End: 2024-02-29
  Administered 2024-02-29: 1 via TRANSDERMAL
  Filled 2024-02-29: qty 1

## 2024-02-29 NOTE — ED Provider Notes (Signed)
 [place] EMERGENCY DEPARTMENT AT [facility] Provider Note   CSN: [id number] Arrival date & time: 02/29/24  1659     History  Chief Complaint  Patient presents with   Back Pain    Harold Young is a 38 y.o. male.   Back Pain   38 year old male presents emergency department with complaints of back pain.  Patient reports he was at work yesterday and lifted a tote when his back pain began.  Reports pain in his low back that does not radiate.  Denies any saddle anesthesia, bowel/bladder dysfunction, weakness/sensory deficits in lower extremities, fever, history of IV drug use, prolonged corticosteroid use, no malignancy.  States has not taken any medication for symptoms.  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  States he wants to make sure that he did not do anything to bad to his back.  Past medical history significant for hypertension, chronic back pain, chronic headache  Home Medications Prior to Admission medications   Medication Sig Start Date End Date Taking? Authorizing Provider  amoxicillin -clavulanate (AUGMENTIN ) 875-125 MG tablet Take 1 tablet by mouth every 12 (twelve) hours. 10/15/23   Albertus Hughs, DO  colchicine  0.6 MG tablet Take two tablets by mouth.  Then an hour later take the last tablet. 10/15/23   Floyd, Dan, DO  cyclobenzaprine  (FLEXERIL ) 10 MG tablet Take 1 tablet (10 mg total) by mouth 2 (two) times daily as needed for muscle spasms. 07/06/20   McDonald, Mia A, PA-C  diclofenac  Sodium (VOLTAREN ) 1 % GEL Apply 2 g topically 4 (four) times daily. 09/29/19   Caccavale, Sophia, PA-C  escitalopram (LEXAPRO) 20 MG tablet Take 20 mg by mouth daily. 03/24/19   [provider]  hydrochlorothiazide  (HYDRODIURIL ) 12.5 MG tablet Take 12.5 mg by mouth daily. 05/25/19   [provider]  Lidocaine  (HM LIDOCAINE  PATCH) 4 % PTCH Apply 1 patch topically daily. 07/06/20   McDonald, Mia A, PA-C  lisinopril  (ZESTRIL ) 20 MG  tablet Take 20 mg by mouth daily. 04/09/19   [provider]  montelukast (SINGULAIR) 10 MG tablet Take 10 mg by mouth at bedtime. 05/25/19   [provider]  predniSONE  (DELTASONE ) 20 MG tablet 2 tabs po daily x 4 days 10/15/23   Floyd, Dan, DO      Allergies    Patient has no known allergies.    Review of Systems   Review of Systems  Musculoskeletal:  Positive for back pain.  All other systems reviewed and are negative.   Physical Exam Updated Vital Signs BP (!) 148/90   Pulse 70   Temp 98 F (36.7 C)   Resp 16   Ht 6\' 3"  (1.905 m)   Wt 115.7 kg   SpO2 99%   BMI 31.87 kg/m  Physical Exam Vitals and nursing note reviewed.  Constitutional:      General: He is not in acute distress.    Appearance: He is well-developed.  HENT:     Head: Normocephalic and atraumatic.  Eyes:     Conjunctiva/sclera: Conjunctivae normal.  Cardiovascular:     Rate and Rhythm: Normal rate and regular rhythm.     Heart sounds: No murmur heard. Pulmonary:     Effort: Pulmonary effort is normal. No respiratory distress.     Breath sounds: Normal breath sounds. No wheezing, rhonchi or rales.  Abdominal:     Palpations: Abdomen is soft.     Tenderness: There is no abdominal tenderness.  Musculoskeletal:        General: No swelling.     Cervical back: Neck supple.     Comments: No midline tenderness cervical thoracic and, lumbar spine with step-off or deformity.  Paraspinal tenderness noted in the left lumbar region.  Muscular strength 5-5 lower extremities.  No sensory deficits on major nerve distributions of lower extremities.  DTR symmetric at patella as well as Achilles.  Pedal posttibial tibial pulses 2+ bilaterally.  Skin:    General: Skin is warm and dry.     Capillary Refill: Capillary refill takes less than 2 seconds.  Neurological:     Mental Status: He is alert.  Psychiatric:        Mood and Affect: Mood normal.     ED Results / Procedures / Treatments    Labs (all labs ordered are listed, but only abnormal results are displayed) Labs Reviewed - No data to display  EKG None  Radiology DG Lumbar Spine Complete Result Date: 02/29/2024 CLINICAL DATA:  Back pain after lifting heavy object. EXAM: LUMBAR SPINE - COMPLETE 4+ VIEW COMPARISON:  Lumbar radiograph 02/24/2019 FINDINGS: Five non-rib-bearing lumbar vertebra. Normal lumbar alignment. Normal vertebral body heights. No fracture or compression deformity. Minor disc space narrowing and spurring at L5-S1. Remaining disc spaces are normal. No visible pars defects. The sacroiliac joints are normal. IMPRESSION: 1. No fracture or subluxation of the lumbar spine. 2. Minor degenerative disc disease at L5-S1. Electronically Signed   By: Chadwick Colonel M.D.   On: 02/29/2024 17:46    Procedures Procedures    Medications Ordered in ED Medications - No data to display  ED Course/ Medical Decision Making/ A&P                                 Medical Decision Making Amount and/or Complexity of Data Reviewed Radiology: ordered.   This patient presents to the ED for concern of back pain, this involves an extensive number of treatment options, and is a complaint that carries with it a high risk of complications and morbidity.  The differential diagnosis includes fracture, strain/sprain, dislocation, cauda equina, spinal epidural abscess, bronchitis, nephrolithiasis, open   Co morbidities that complicate the patient evaluation  See HPI   Additional history obtained:  Additional history obtained from EMR External records from outside source obtained and reviewed including hospital records   Lab Tests:  N/a   Imaging Studies ordered:  I ordered imaging studies including lumbar x-ray I independently visualized and interpreted imaging which showed no acute osseous abnormality.  Degenerative changes. I agree with the radiologist interpretation   Cardiac Monitoring: /  EKG:  N/a   Consultations Obtained:  N/a   Problem List / ED Course / Critical interventions / Medication management  Low back pain I ordered medication including Toradol , Lidoderm    Reevaluation of the patient after these medicines showed that the patient improved I have reviewed the patients home medicines and have made adjustments as needed   Social Determinants of Health:  Denies tobacco, licit drug use.   Test / Admission - Considered:  Low back pain Vitals signs significant for hypertension blood pressure 148/90. Otherwise within normal range and stable throughout visit. Imaging studies significant for: See above 38 year old male presents emergency department with complaints of back pain.  Patient reports he was at work yesterday and lifted a tote when his back pain began.  Reports pain in his low back  that does not radiate.  Denies any saddle anesthesia, bowel/bladder dysfunction, weakness/sensory deficits in lower extremities, fever, history of IV drug use, prolonged corticosteroid use, no malignancy.  States has not taken any medication for symptoms.  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  States he wants to make sure that he did not do anything to bad to his back. On exam, paraspinal tenderness in the left lumbar region.  No CVA tenderness or urinary symptoms to be suspicious for pyelonephritis, nephrolithiasis.  Patient without any red flag signs for back pain on HPI/PE; low suspicion for cauda equina, spinal epidural abscess, other spinal cord compression/impingement.  X-ray obtained by triage staff was negative for any acute osseous abnormality.  Suspect muscular injury.  Will treat with symptomatic therapy as described in AVS and recommend follow-up with primary care for reassessment.  Treatment plan discussed with patient and he acknowledged understanding was agreeable to said plan.  Patient overall well-appearing, afebrile in no  acute distress. Worrisome signs and symptoms were discussed with the patient, and the patient acknowledged understanding to return to the ED if noticed. Patient was stable upon discharge.          Final Clinical Impression(s) / ED Diagnoses Final diagnoses:  None    Rx / DC Orders ED Discharge Orders     None         [place], [place] 02/29/24 Tennis Feinstein    Mozell Arias, MD 03/01/24 1447

## 2024-02-29 NOTE — ED Triage Notes (Signed)
 Pt POV reporting lower back after lifting heavy object at work, increased pain when standing up straight.

## 2024-02-29 NOTE — Discharge Instructions (Addendum)
 Your x-ray did not show any obvious fracture or dislocation of the low back.  Will send you home with anti-inflammatories to take for pain as well as a muscle relaxer.  Muscle actually can cause drowsiness so please not drive or perform any high-risk activity until you realize that it is effects on you.  Recommend follow-up with your primary care for reassessment.  Please not hesitate to return to emergency department if the worrisome signs and symptoms we discussed become apparent

## 2024-04-28 ENCOUNTER — Emergency Department (HOSPITAL_BASED_OUTPATIENT_CLINIC_OR_DEPARTMENT_OTHER)
Admission: EM | Admit: 2024-04-28 | Discharge: 2024-04-28 | Disposition: A | Discharge status: Home / Self Care | Attending: Emergency Medicine | Admitting: Emergency Medicine

## 2024-04-28 ENCOUNTER — Emergency Department (HOSPITAL_BASED_OUTPATIENT_CLINIC_OR_DEPARTMENT_OTHER)

## 2024-04-28 ENCOUNTER — Other Ambulatory Visit: Payer: Self-pay

## 2024-04-28 DIAGNOSIS — M25561 Pain in right knee: Secondary | ICD-10-CM | POA: Diagnosis present

## 2024-04-28 DIAGNOSIS — M25461 Effusion, right knee: Secondary | ICD-10-CM | POA: Insufficient documentation

## 2024-04-28 DIAGNOSIS — Y92410 Unspecified street and highway as the place of occurrence of the external cause: Secondary | ICD-10-CM | POA: Insufficient documentation

## 2024-04-28 MED ORDER — DEXAMETHASONE 4 MG PO TABS
10.0000 mg | ORAL_TABLET | Freq: Once | ORAL | Status: AC
  Administered 2024-04-28: 10 mg via ORAL
  Filled 2024-04-28: qty 3

## 2024-04-28 MED ORDER — METHYLPREDNISOLONE 4 MG PO TBPK: ORAL_TABLET | ORAL | 0 refills | Status: AC

## 2024-04-28 NOTE — ED Provider Notes (Addendum)
 [place] [facility] AT [facility] Provider Note   CSN: [id number] Arrival date & time: 04/28/24  1607     Patient presents with: Knee Pain   Harold Young is a 38 y.o. male.   Patient with right knee pain for last couple days.  Swelling to the right knee.  History of arthritis and inflammation in the past.  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  Walks around a hard floor.  History of chronic back pain.  Denies any prior injury.  Denies any specific trauma to the knee.  Has followed with orthopedics in the past for this.  No gout history per fluid that was removed from the knee in the past.  Denies any weakness numbness tingling.  Denies any fevers or chills.  The history is provided by the patient.       Prior to Admission medications   Medication Sig Start Date End Date Taking? Authorizing Provider  methylPREDNISolone  (MEDROL  DOSEPAK) 4 MG TBPK tablet Follow package insert 04/28/24  Yes Harold Hagin, Young  amoxicillin -clavulanate (AUGMENTIN ) 875-125 MG tablet Take 1 tablet by mouth every 12 (twelve) hours. 10/15/23   Harold Share, Young  celecoxib  (CELEBREX ) 200 MG capsule Take 1 capsule (200 mg total) by mouth 2 (two) times daily as needed. 02/29/24   Harold Wonda LABOR, PA  colchicine  0.6 MG tablet Take two tablets by mouth.  Then an hour later take the last tablet. 10/15/23   Harold Young  cyclobenzaprine  (FLEXERIL ) 10 MG tablet Take 1 tablet (10 mg total) by mouth 2 (two) times daily as needed for muscle spasms. 02/29/24   Harold Wonda LABOR, PA  diclofenac  Sodium (VOLTAREN ) 1 % GEL Apply 2 g topically 4 (four) times daily. 09/29/19   Harold Young  escitalopram (LEXAPRO) 20 MG tablet Take 20 mg by mouth daily. 03/24/19   Harold Young  hydrochlorothiazide  (HYDRODIURIL ) 12.5 MG tablet Take 12.5 mg by mouth daily. 05/25/19   Harold Young  lisinopril  (ZESTRIL ) 20 MG tablet Take 20 mg by mouth daily. 04/09/19   Provider, Historical,  Young  montelukast (SINGULAIR) 10 MG tablet Take 10 mg by mouth at bedtime. 05/25/19   Harold Young  predniSONE  (DELTASONE ) 20 MG tablet 2 tabs po daily x 4 days 10/15/23   Harold Young    Allergies: Patient has no known allergies.    Review of Systems  Updated Vital Signs BP (!) 159/100 (BP Location: Right Arm)   Pulse 66   Temp 98.5 F (36.9 C)   Resp 16   Ht 6' 3 (1.905 m)   Wt 113.4 kg   SpO2 100%   BMI 31.25 kg/m   Physical Exam Vitals and nursing note reviewed.  Constitutional:      General: He is not in acute distress.    Appearance: He is well-developed.  HENT:     Head: Normocephalic and atraumatic.  Eyes:     Conjunctiva/sclera: Conjunctivae normal.  Cardiovascular:     Rate and Rhythm: Normal rate and regular rhythm.     Pulses: Normal pulses.     Heart sounds: No murmur heard. Pulmonary:     Effort: Pulmonary effort is normal. No respiratory distress.     Breath sounds: Normal breath sounds.  Abdominal:     Palpations: Abdomen is soft.     Tenderness: There is no abdominal tenderness.  Musculoskeletal:        General: Swelling and tenderness present.  Cervical back: Neck supple.     Comments: Tenderness and swelling to the right knee but there is no warmth no erythema, good range of motion  Skin:    General: Skin is warm and dry.     Capillary Refill: Capillary refill takes less than 2 seconds.  Neurological:     General: No focal deficit present.     Mental Status: He is alert.     Sensory: No sensory deficit.     Motor: No weakness.  Psychiatric:        Mood and Affect: Mood normal.     (all labs ordered are listed, but only abnormal results are displayed) Labs Reviewed - No data to display  EKG: None  Radiology: DG Knee Complete 4 Views Right Result Date: 04/28/2024 CLINICAL DATA:  Right knee pain and swelling for several days EXAM: RIGHT KNEE - COMPLETE 4+ VIEW COMPARISON:  None Available. FINDINGS: Frontal, bilateral  oblique, lateral views of the right knee are obtained on 5 images. No acute fracture, subluxation, or dislocation. Mild 3 compartmental osteoarthritis greatest in the medial and patellofemoral compartments. Large suprapatellar joint effusion. The soft tissues are unremarkable. IMPRESSION: 1. No acute fracture. 2. Mild 3 compartmental osteoarthritis. 3. Large right knee effusion. Electronically Signed   By: Harold Daring M.D.   On: 04/28/2024 16:59     Procedures   Medications Ordered in the ED  dexamethasone  (DECADRON ) tablet 10 mg (10 mg Oral Given 04/28/24 1639)                                    Medical Decision Making Amount and/or Complexity of Data Reviewed Radiology: ordered.  Risk Prescription drug management.   Harold Young is here with right knee pain.  Overall differential diagnosis likely inflammatory process.  Have no concern for septic joint.  X-ray was obtained that showed no fracture or malalignment.  This show joint effusion.  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  Ultimately I think that this is an inflammatory process.  Will place an Ace wrap.  Will have him use crutches with minimal weightbearing.  Will put him on a Medrol  Dosepak.  Recommend ice and rest and compression and steroids.  Will have him follow-up with his orthopedic doctor for possible joint aspiration if not improved.  I Young not have any concern for infectious process or other acute process otherwise.  Neurovascular neuromuscularly intact on exam.  Discharge.  This chart was dictated using voice recognition software.  Despite best efforts to proofread,  errors can occur which can change the documentation meaning.      Final diagnoses:  Acute pain of right knee  Effusion of right knee    ED Discharge Orders          Ordered    methylPREDNISolone  (MEDROL  DOSEPAK) 4 MG TBPK tablet        04/28/24 1624               Harold Campas, Young 04/28/24 1626     Harold Alicia, Young 04/28/24 1720

## 2024-04-28 NOTE — ED Triage Notes (Signed)
 States right knee pain for 4 days. Denies injury. Significant swelling to knee.

## 2024-04-28 NOTE — Discharge Instructions (Addendum)
 Overall I think you have inflammation in your knee.  Take steroids as prescribed with the next dose tomorrow.  Recommend Ace wrap for compression.  Recommend ice 20 minutes on several times a day.  Would use crutches to minimally weight-bear to right lower extremity.  I would contact your orthopedic doctor for follow-up if not improving as at that time they may want to attempt to remove the fluid from the knee or do a knee injection.  My hope is that things will improve with rest compression and anti-inflammatories.

## 2024-08-15 ENCOUNTER — Other Ambulatory Visit: Payer: Self-pay | Admitting: Nephrology

## 2024-08-15 DIAGNOSIS — N1832 Chronic kidney disease, stage 3b: Secondary | ICD-10-CM

## 2024-08-25 ENCOUNTER — Other Ambulatory Visit

## 2024-08-25 NOTE — Addendum Note (Signed)
 Encounter addended by: Eldora Jon GRADE on: 08/25/2024 3:59 PM  Actions taken: Imaging Exam ended

## 2024-09-05 ENCOUNTER — Inpatient Hospital Stay: Admission: RE | Admit: 2024-09-05 | Discharge: 2024-09-05 | Attending: Nephrology | Admitting: Nephrology

## 2024-09-05 DIAGNOSIS — N1832 Chronic kidney disease, stage 3b: Secondary | ICD-10-CM

## 2024-10-19 ENCOUNTER — Other Ambulatory Visit: Payer: Self-pay

## 2024-10-19 ENCOUNTER — Emergency Department (HOSPITAL_BASED_OUTPATIENT_CLINIC_OR_DEPARTMENT_OTHER)

## 2024-10-19 ENCOUNTER — Encounter (HOSPITAL_BASED_OUTPATIENT_CLINIC_OR_DEPARTMENT_OTHER): Payer: Self-pay | Admitting: Emergency Medicine

## 2024-10-19 ENCOUNTER — Emergency Department (HOSPITAL_BASED_OUTPATIENT_CLINIC_OR_DEPARTMENT_OTHER)
Admission: EM | Admit: 2024-10-19 | Discharge: 2024-10-19 | Disposition: A | Discharge status: Home / Self Care | Attending: Emergency Medicine | Admitting: Emergency Medicine

## 2024-10-19 DIAGNOSIS — M10372 Gout due to renal impairment, left ankle and foot: Secondary | ICD-10-CM | POA: Diagnosis not present

## 2024-10-19 DIAGNOSIS — M25472 Effusion, left ankle: Secondary | ICD-10-CM | POA: Diagnosis present

## 2024-10-19 DIAGNOSIS — Z79899 Other long term (current) drug therapy: Secondary | ICD-10-CM | POA: Diagnosis not present

## 2024-10-19 DIAGNOSIS — Q613 Polycystic kidney, unspecified: Secondary | ICD-10-CM | POA: Insufficient documentation

## 2024-10-19 DIAGNOSIS — I10 Essential (primary) hypertension: Secondary | ICD-10-CM | POA: Insufficient documentation

## 2024-10-19 HISTORY — DX: Gout, unspecified: M10.9

## 2024-10-19 MED ORDER — OXYCODONE-ACETAMINOPHEN 5-325 MG PO TABS
1.0000 | ORAL_TABLET | Freq: Once | ORAL | Status: AC
  Administered 2024-10-19: 1 via ORAL
  Filled 2024-10-19: qty 1

## 2024-10-19 MED ORDER — OXYCODONE-ACETAMINOPHEN 5-325 MG PO TABS: 1.0000 | ORAL_TABLET | Freq: Four times a day (QID) | ORAL | 0 refills | Status: AC | PRN

## 2024-10-19 MED ORDER — PREDNISONE 50 MG PO TABS
60.0000 mg | ORAL_TABLET | Freq: Once | ORAL | Status: AC
  Administered 2024-10-19: 60 mg via ORAL
  Filled 2024-10-19: qty 1

## 2024-10-19 MED ORDER — PREDNISONE 20 MG PO TABS: 40.0000 mg | ORAL_TABLET | Freq: Every day | ORAL | 0 refills | Status: AC

## 2024-10-19 NOTE — ED Triage Notes (Signed)
 Pt via pov from home with swollen left foot x 2-3 days. Pt states it is so swollen that he can barely put a sock or shoe on and is unable to bear weight. Pt has hx of gout, thinks this may be the same. Pt a&o x 4; nad noted.

## 2024-10-19 NOTE — ED Provider Notes (Signed)
 " [place] [facility] DEPARTMENT AT [facility] Provider Note   CSN: [id number] Arrival date & time: 10/19/24  1916     Patient presents with: Foot Pain   Harold Young is a 39 y.o. male.   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  He has had significant pain and swelling of that foot but denies any fevers.  No injury.  Reports he has had gout before in the right great toe and knee but never in the left ankle.  He has taken some Tylenol  at home but is not helping with the pain.  It is excruciating when he tries to walk.  The history is provided by the patient and the spouse.  Foot Pain       Prior to Admission medications  Medication Sig Start Date End Date Taking? Authorizing Provider  oxyCODONE -acetaminophen  (PERCOCET/ROXICET) 5-325 MG tablet Take 1 tablet by mouth every 6 (six) hours as needed for severe pain (pain score 7-10). 10/19/24  Yes Doretha Folks, MD  predniSONE  (DELTASONE ) 20 MG tablet Take 2 tablets (40 mg total) by mouth daily. 10/19/24  Yes Doretha Folks, MD  amoxicillin -clavulanate (AUGMENTIN ) 875-125 MG tablet Take 1 tablet by mouth every 12 (twelve) hours. 10/15/23   Emil Share, DO  celecoxib  (CELEBREX ) 200 MG capsule Take 1 capsule (200 mg total) by mouth 2 (two) times daily as needed. 02/29/24   Silver Wonda LABOR, PA  colchicine  0.6 MG tablet Take two tablets by mouth.  Then an hour later take the last tablet. 10/15/23   Floyd, Dan, DO  cyclobenzaprine  (FLEXERIL ) 10 MG tablet Take 1 tablet (10 mg total) by mouth 2 (two) times daily as needed for muscle spasms. 02/29/24   Silver Wonda LABOR, PA  diclofenac  Sodium (VOLTAREN ) 1 % GEL Apply 2 g topically 4 (four) times daily. 09/29/19   Caccavale, Sophia, PA-C  escitalopram (LEXAPRO) 20 MG tablet Take 20 mg by mouth daily.  03/24/19   [provider]  hydrochlorothiazide  (HYDRODIURIL ) 12.5 MG tablet Take 12.5 mg by mouth daily. 05/25/19   [provider]  lisinopril  (ZESTRIL ) 20 MG tablet Take 20 mg by mouth daily. 04/09/19   [provider]  methylPREDNISolone  (MEDROL  DOSEPAK) 4 MG TBPK tablet Follow package insert 04/28/24   Curatolo, Adam, DO  montelukast (SINGULAIR) 10 MG tablet Take 10 mg by mouth at bedtime. 05/25/19   [provider]    Allergies: Patient has no known allergies.    Review of Systems  Updated Vital Signs BP (!) 151/92   Pulse 97   Temp 98.4 F (36.9 C)   Resp 20   Ht 6' 3 (1.905 m)   Wt 106.6 kg   SpO2 100%   BMI 29.37 kg/m   Physical Exam Constitutional:      General: He is not in acute distress. HENT:     Head: Normocephalic.  Cardiovascular:     Rate and Rhythm: Normal rate.     Pulses: Normal pulses.  Pulmonary:     Effort: Pulmonary effort is normal.  Musculoskeletal:        General: Swelling and tenderness present.     Comments: Mild warmth noted over the left ankle with pain with palpation and swelling noted.  Neurological:     Mental Status: He is alert. Mental  status is at baseline.     (all labs ordered are listed, but only abnormal results are displayed) Labs Reviewed - No data to display  EKG: None  Radiology: DG Foot Complete Left Result Date: 10/19/2024 EXAM: 3 OR MORE VIEW(S) XRAY OF THE LEFT FOOT 10/19/2024 07:34:00 PM COMPARISON: Comparison with 03/02/2023. CLINICAL HISTORY: Swelling. ICD10: [id number] Injury to blood vessels of thorax without open wound into cavity. FINDINGS: BONES AND JOINTS: No acute fracture. No malalignment. Small superior and plantar calcaneal spurs. Mild degenerative changes in the intertarsal joints. No periarticular erosions are identified. SOFT TISSUES: Mild diffuse subcutaneous edema. No radiopaque soft tissue foreign bodies or soft tissue gas. IMPRESSION: 1. Mild diffuse subcutaneous edema.  Electronically signed by: Elsie Gravely MD 10/19/2024 07:37 PM EST RP Workstation: [id number]     Procedures   Medications Ordered in the ED  predniSONE  (DELTASONE ) tablet 60 mg (has no administration in time range)  oxyCODONE -acetaminophen  (PERCOCET/ROXICET) 5-325 MG per tablet 1 tablet (has no administration in time range)                                    Medical Decision Making Amount and/or Complexity of Data Reviewed Radiology: ordered and independent interpretation performed. Decision-making details documented in ED Course.  Risk Prescription drug management.   Patient presenting today with symptoms most classic for gout.  Low suspicion for septic joint at this time and patient denies any trauma.  Most likely coming from patient's known kidney disease.  In the past he has also been on hydrochlorothiazide  but reports he is only taking lisinopril  at this time.  Due to patient's kidney disease he should not take NSAIDs.  Will give prednisone  and pain control.  Gave return precautions.  I have independently visualized and interpreted pt's images today.  Ankle imaging negative for acute process but radiology reports subcutaneous swelling.      Final diagnoses:  Acute gout due to renal impairment involving left ankle    ED Discharge Orders          Ordered    oxyCODONE -acetaminophen  (PERCOCET/ROXICET) 5-325 MG tablet  Every 6 hours PRN        10/19/24 2246    predniSONE  (DELTASONE ) 20 MG tablet  Daily        10/19/24 2246               Doretha Folks, MD 10/19/24 2247  "

## 2024-10-19 NOTE — ED Notes (Signed)
 Drink given per request

## 2024-10-19 NOTE — ED Notes (Signed)
 Reviewed AVS/discharge instruction with patient. Time allotted for and all questions answered. Patient is agreeable for d/c and escorted to ed exit by staff.
